# Patient Record
Sex: Female | Born: 1950
Health system: Southern US, Community
[De-identification: ages and names within clinical notes are randomized; demographics above are authoritative.]

## PROBLEM LIST (undated history)

## (undated) DIAGNOSIS — G43019 Migraine without aura, intractable, without status migrainosus: Secondary | ICD-10-CM

## (undated) DIAGNOSIS — B019 Varicella without complication: Secondary | ICD-10-CM

## (undated) DIAGNOSIS — T7840XA Allergy, unspecified, initial encounter: Secondary | ICD-10-CM

## (undated) DIAGNOSIS — J45909 Unspecified asthma, uncomplicated: Secondary | ICD-10-CM

## (undated) DIAGNOSIS — M069 Rheumatoid arthritis, unspecified: Secondary | ICD-10-CM

## (undated) DIAGNOSIS — M810 Age-related osteoporosis without current pathological fracture: Secondary | ICD-10-CM

## (undated) DIAGNOSIS — B2790 Infectious mononucleosis, unspecified without complication: Secondary | ICD-10-CM

## (undated) DIAGNOSIS — A0472 Enterocolitis due to Clostridium difficile, not specified as recurrent: Secondary | ICD-10-CM

## (undated) HISTORY — DX: Unspecified asthma, uncomplicated: J45.909

## (undated) HISTORY — DX: Enterocolitis due to Clostridium difficile, not specified as recurrent: A04.72

## (undated) HISTORY — DX: Varicella without complication: B01.9

## (undated) HISTORY — DX: Migraine without aura, intractable, without status migrainosus: G43.019

## (undated) HISTORY — DX: Allergy, unspecified, initial encounter: T78.40XA

## (undated) HISTORY — PX: BREAST CYST EXCISION: SHX579

## (undated) HISTORY — DX: Rheumatoid arthritis, unspecified: M06.9

## (undated) HISTORY — PX: HERNIA REPAIR: SHX51

## (undated) HISTORY — DX: Infectious mononucleosis, unspecified without complication: B27.90

## (undated) HISTORY — PX: KIDNEY STONE SURGERY: SHX686

## (undated) HISTORY — DX: Age-related osteoporosis without current pathological fracture: M81.0

---

## 1984-09-04 DIAGNOSIS — B2790 Infectious mononucleosis, unspecified without complication: Secondary | ICD-10-CM

## 1984-09-04 HISTORY — DX: Infectious mononucleosis, unspecified without complication: B27.90

## 1988-09-04 HISTORY — PX: KNEE SURGERY: SHX244

## 1991-09-05 HISTORY — PX: LAPAROSCOPY: SHX197

## 1992-09-04 HISTORY — PX: OTHER SURGICAL HISTORY: SHX169

## 1996-09-04 HISTORY — PX: ANTERIOR CRUCIATE LIGAMENT REPAIR: SHX115

## 2010-12-29 ENCOUNTER — Ambulatory Visit (INDEPENDENT_AMBULATORY_CARE_PROVIDER_SITE_OTHER): Payer: BC Managed Care – PPO | Admitting: Family Medicine

## 2010-12-29 ENCOUNTER — Encounter: Payer: Self-pay | Admitting: Family Medicine

## 2010-12-29 DIAGNOSIS — Z0000 Encounter for general adult medical examination without abnormal findings: Secondary | ICD-10-CM

## 2010-12-29 LAB — POCT URINALYSIS DIPSTICK
Ketones, UA: NEGATIVE
Protein, UA: NEGATIVE
Spec Grav, UA: 1.015
Urobilinogen, UA: 0.2
pH, UA: 8.5

## 2010-12-29 NOTE — Progress Notes (Signed)
  Subjective:    Patient ID: April Waters, female    DOB: 02-02-1951, 60 y.o.   MRN: [id number]  April Waters is a 60 year old, married female, registered nurse, who recently moved here from [place] who comes in today as a new patient to get the general physical examination  She's always been in good, health she's had no chronic health problems.  She did have bilateral inguinal hernia repairs as a 61-month-old baby, nasal polyps, removed, sebaceous cyst, right breast at age 19, torn cartilage, right knee, 1990 ACL right knee, 1998, diagnostic laparoscopy for endometriosis 1993.  She does have a history of migraine headaches that are related to food.  She needs an eye exam referred to Dr. Vonna Kotyk.  Also needs a dentist referred to Dr. Alvester Morin.  She said the history of cardiac or pulmonary disease.  She does have occasional palpitations skipping she declines any medication.  It is not related to exercise.  He's had a flexible sigmoidoscopy and a colonoscopy.  She did have some bright red rectal bleeding in March for 3 days.  Pap October 2011 normal.  She does not do BSE monthly mammogram October 2011 normal.    Review of Systems  Constitutional: Negative.   HENT: Negative.   Eyes: Negative.   Respiratory: Negative.   Cardiovascular: Negative.   Gastrointestinal: Negative.   Genitourinary: Negative.   Musculoskeletal: Negative.   Neurological: Negative.   Hematological: Negative.   Psychiatric/Behavioral: Negative.        Objective:   Physical Exam  Constitutional: She appears well-developed and well-nourished.  HENT:  Head: Normocephalic and atraumatic.  Right Ear: External ear normal.  Left Ear: External ear normal.  Nose: Nose normal.  Mouth/Throat: Oropharynx is clear and moist.  Eyes: EOM are normal. Pupils are equal, round, and reactive to light.  Neck: Normal range of motion. Neck supple. No thyromegaly present.  Cardiovascular: Normal rate, regular rhythm, normal heart sounds and  intact distal pulses.  Exam reveals no gallop and no friction rub.   No murmur heard. Pulmonary/Chest: Effort normal and breath sounds normal.  Abdominal: Soft. Bowel sounds are normal. She exhibits no distension and no mass. There is no tenderness. There is no rebound.  Genitourinary: Guaiac negative stool.       Cystic changes bilaterally  Musculoskeletal: Normal range of motion.  Lymphadenopathy:    She has no cervical adenopathy.  Neurological: She is alert. She has normal reflexes. No cranial nerve deficit. She exhibits normal muscle tone. Coordination normal.  Skin: Skin is warm and dry.  Psychiatric: She has a normal mood and affect. Her behavior is normal. Judgment and thought content normal.          Assessment & Plan:  Healthy female.  Migraine headaches.  Continue home therapy.  History of bright red rectal bleeding resolved.  Normal colonoscopy.  Occasional palpitations.  Declines any medication at this time.  History of kidney stone x 1 pass spontaneously.  History of fibrocystic breast changes.  Recommend annual mammography in BSE monthly.  Status post inguinal hernia repair at age 41 months.  Status post nasal polyps removed.  History of sebaceous cyst removal, right breast at age 66.  Torn cartilage right knee, along with ACL repair.  History of endometriosis premenopausal.  Patient was advised to continue her excellent health habits and return in one year for follow-up, sooner if any problem

## 2010-12-29 NOTE — Patient Instructions (Signed)
see Dr. Leonard Schwartz. This for an eye exam and Dr. Alvester Morin for dental care.  Continued good health habits and follow-up in one year for a physical exam, sooner if any problems

## 2010-12-30 LAB — HEPATIC FUNCTION PANEL
Albumin: 4.2 g/dL (ref 3.5–5.2)
Alkaline Phosphatase: 45 U/L (ref 39–117)
Bilirubin, Direct: 0 mg/dL (ref 0.0–0.3)
Total Protein: 6.2 g/dL (ref 6.0–8.3)

## 2010-12-30 LAB — BASIC METABOLIC PANEL
Calcium: 10.1 mg/dL (ref 8.4–10.5)
Creatinine, Ser: 0.5 mg/dL (ref 0.4–1.2)
GFR: 125.48 mL/min (ref >60.00)
Glucose, Bld: 93 mg/dL (ref 70–99)
Sodium: 138 mEq/L (ref 135–145)

## 2010-12-30 LAB — LIPID PANEL
HDL: 82 mg/dL (ref >39.00)
Triglycerides: 192 mg/dL — ABNORMAL HIGH (ref 0.0–149.0)
VLDL: 38.4 mg/dL (ref 0.0–40.0)

## 2010-12-30 LAB — LDL CHOLESTEROL, DIRECT: Direct LDL: 128.1 mg/dL

## 2011-01-01 LAB — VITAMIN D 1,25 DIHYDROXY: Vitamin D 1, 25 (OH)2 Total: 60 pg/mL (ref 18–72)

## 2011-01-04 NOTE — Progress Notes (Signed)
Left message labs were normal

## 2011-01-09 ENCOUNTER — Ambulatory Visit: Payer: Self-pay | Admitting: Internal Medicine

## 2011-01-12 ENCOUNTER — Encounter: Payer: Self-pay | Admitting: Family Medicine

## 2011-01-16 ENCOUNTER — Ambulatory Visit: Payer: Self-pay | Admitting: Family Medicine

## 2011-03-09 ENCOUNTER — Telehealth: Payer: Self-pay | Admitting: Family Medicine

## 2011-03-09 NOTE — Telephone Encounter (Signed)
Pt called and has questions re: ppd skin test. Pls call.

## 2011-03-09 NOTE — Telephone Encounter (Signed)
patient  Coming in for ppd placement

## 2011-03-13 ENCOUNTER — Ambulatory Visit: Payer: BC Managed Care – PPO | Admitting: Family Medicine

## 2011-03-28 ENCOUNTER — Ambulatory Visit: Payer: Self-pay | Admitting: Family Medicine

## 2011-05-19 ENCOUNTER — Ambulatory Visit (INDEPENDENT_AMBULATORY_CARE_PROVIDER_SITE_OTHER): Payer: BC Managed Care – PPO | Admitting: *Deleted

## 2011-05-19 DIAGNOSIS — Z23 Encounter for immunization: Secondary | ICD-10-CM

## 2011-06-20 ENCOUNTER — Ambulatory Visit (INDEPENDENT_AMBULATORY_CARE_PROVIDER_SITE_OTHER): Payer: BC Managed Care – PPO | Admitting: Family Medicine

## 2011-06-20 ENCOUNTER — Encounter: Payer: Self-pay | Admitting: Family Medicine

## 2011-06-20 ENCOUNTER — Other Ambulatory Visit: Payer: Self-pay | Admitting: Family Medicine

## 2011-06-20 VITALS — BP 98/64 | Temp 97.5°F | Ht 64.0 in | Wt 91.0 lb

## 2011-06-20 DIAGNOSIS — D235 Other benign neoplasm of skin of trunk: Secondary | ICD-10-CM | POA: Insufficient documentation

## 2011-06-20 NOTE — Patient Instructions (Signed)
Remove the Band-Aid tomorrow.  If we do not call you in the next two weeks with the path report.  Then call us.

## 2011-06-20 NOTE — Progress Notes (Signed)
  Subjective:    Patient ID: April Waters, female    DOB: 20-Oct-1950, 60 y.o.   MRN: [id number]  HPI April Waters is a 60 year old female, who comes in today for evaluation of an irritated lesion on her back.  She states for the past two, months she's had a spot on her back has been itching and irritated.  On physical exam its a mole that has some irregular margins.  She has dark skin.  However, she grew up in [place] and had a lot of sun exposure and some burning when she was a teenager.  No previous history of any skin problems.  After informed consent she was taken to the treatment room and the lesion was anesthetized with 1% Xylocaine with epinephrine.  The lesion was excised with 3-mm margins and sent for pathologic analysis.  The base was cauterized and it was applied.  She tolerated the procedure well.  No complications.  Size the lesion.  8 mm times 8 mm  Clinical diagnosis probable dysplastic nevus.  Rule out melanoma   Review of Systems    General in dermatologic review of systems otherwise negative Objective:   Physical Exam Procedure see above       Assessment & Plan:  Lesion removed, rule out melanoma.  Probable dysplastic nevus

## 2011-11-13 ENCOUNTER — Telehealth: Payer: Self-pay | Admitting: Family Medicine

## 2011-11-13 NOTE — Telephone Encounter (Signed)
Opened in error

## 2011-12-25 ENCOUNTER — Other Ambulatory Visit: Payer: BC Managed Care – PPO

## 2012-01-01 ENCOUNTER — Encounter: Payer: BC Managed Care – PPO | Admitting: Family Medicine

## 2012-01-05 ENCOUNTER — Telehealth: Payer: Self-pay | Admitting: Family Medicine

## 2012-01-05 NOTE — Telephone Encounter (Signed)
Lab test ordered and husband is aware

## 2012-01-05 NOTE — Telephone Encounter (Signed)
Pt is reg to get a lab test for Rheumatoid Arthiritis added to her cpx labs in Aug. Pls advise.

## 2012-01-05 NOTE — Telephone Encounter (Signed)
Yesi

## 2012-02-08 ENCOUNTER — Other Ambulatory Visit (INDEPENDENT_AMBULATORY_CARE_PROVIDER_SITE_OTHER): Payer: BC Managed Care – PPO

## 2012-02-08 DIAGNOSIS — Z0000 Encounter for general adult medical examination without abnormal findings: Secondary | ICD-10-CM

## 2012-02-08 LAB — TSH: TSH: 0.9 u[IU]/mL (ref 0.35–5.50)

## 2012-02-08 LAB — HEPATIC FUNCTION PANEL
ALT: 22 U/L (ref 0–35)
AST: 26 U/L (ref 0–37)
Albumin: 4.1 g/dL (ref 3.5–5.2)
Total Bilirubin: 0.7 mg/dL (ref 0.3–1.2)
Total Protein: 6.3 g/dL (ref 6.0–8.3)

## 2012-02-08 LAB — BASIC METABOLIC PANEL
BUN: 9 mg/dL (ref 6–23)
Chloride: 107 mEq/L (ref 96–112)
Glucose, Bld: 71 mg/dL (ref 70–99)
Potassium: 4.5 mEq/L (ref 3.5–5.1)
Sodium: 143 mEq/L (ref 135–145)

## 2012-02-08 LAB — CBC WITH DIFFERENTIAL/PLATELET
Eosinophils Relative: 3.1 % (ref 0.0–5.0)
HCT: 44.5 % (ref 36.0–46.0)
Hemoglobin: 14.8 g/dL (ref 12.0–15.0)
Lymphs Abs: 0.9 10*3/uL (ref 0.7–4.0)
MCV: 92.7 fl (ref 78.0–100.0)
Monocytes Absolute: 0.4 10*3/uL (ref 0.1–1.0)
Neutro Abs: 3.2 10*3/uL (ref 1.4–7.7)
Platelets: 231 10*3/uL (ref 150.0–400.0)
WBC: 4.7 10*3/uL (ref 4.5–10.5)

## 2012-02-08 LAB — LIPID PANEL
Cholesterol: 182 mg/dL (ref 0–200)
VLDL: 7.4 mg/dL (ref 0.0–40.0)

## 2012-02-09 LAB — CYCLIC CITRUL PEPTIDE ANTIBODY, IGG: Cyclic Citrullin Peptide Ab: <2 U/mL (ref 0.0–5.0)

## 2012-04-04 ENCOUNTER — Other Ambulatory Visit: Payer: BC Managed Care – PPO

## 2012-04-09 ENCOUNTER — Encounter: Payer: Self-pay | Admitting: Family Medicine

## 2012-04-09 ENCOUNTER — Ambulatory Visit (INDEPENDENT_AMBULATORY_CARE_PROVIDER_SITE_OTHER): Payer: BC Managed Care – PPO | Admitting: Family Medicine

## 2012-04-09 ENCOUNTER — Encounter: Payer: BC Managed Care – PPO | Admitting: Family Medicine

## 2012-04-09 VITALS — BP 102/64 | Temp 98.1°F | Ht 63.5 in | Wt 91.0 lb

## 2012-04-09 DIAGNOSIS — R000 Tachycardia, unspecified: Secondary | ICD-10-CM | POA: Insufficient documentation

## 2012-04-09 DIAGNOSIS — N760 Acute vaginitis: Secondary | ICD-10-CM

## 2012-04-09 DIAGNOSIS — Z0000 Encounter for general adult medical examination without abnormal findings: Secondary | ICD-10-CM | POA: Insufficient documentation

## 2012-04-09 DIAGNOSIS — I499 Cardiac arrhythmia, unspecified: Secondary | ICD-10-CM

## 2012-04-09 DIAGNOSIS — D235 Other benign neoplasm of skin of trunk: Secondary | ICD-10-CM

## 2012-04-09 MED ORDER — ESTROGENS, CONJUGATED 0.625 MG/GM VA CREA: TOPICAL_CREAM | Freq: Every day | VAGINAL | Status: DC

## 2012-04-09 NOTE — Progress Notes (Signed)
  Subjective:    Patient ID: April Waters, female    DOB: Apr 26, 1951, 61 y.o.   MRN: [id number]  HPI April Waters is a 61 year old married female nonsmoker who comes in today for general physical examination  She's always been in excellent health she's had no chronic health problems. She exercises on a regular basis her weight is 91 pounds BP 102/64  She states for the past for 5 years she's had episodes of intermittent rapid heart rate. Sometimes last for a few seconds sometimes will last longer. She'll and drink small amounts of caffeine daily. She's able to exercise with no chest pain shortness of breath.  She's had some soreness in her hands and has had tests for arthritis the past they've all been negative  She gets routine eye care, dental care, mammogram this spring normal, she does not check her breasts monthly, colonoscopy 2005 normal. She brings in a CT scan from [place] dated 2006. At that time she was noted to have cystic lesions in the liver. Since his been 9 years and she's had no problems I do not take rescan he is indicated   Review of Systems  Constitutional: Negative.   HENT: Negative.   Eyes: Negative.   Respiratory: Negative.   Cardiovascular: Negative.   Gastrointestinal: Negative.   Genitourinary: Negative.   Musculoskeletal: Negative.   Neurological: Negative.   Hematological: Negative.   Psychiatric/Behavioral: Negative.        Objective:   Physical Exam  Constitutional: She appears well-developed and well-nourished.  HENT:  Head: Normocephalic and atraumatic.  Right Ear: External ear normal.  Left Ear: External ear normal.  Nose: Nose normal.  Mouth/Throat: Oropharynx is clear and moist.  Eyes: EOM are normal. Pupils are equal, round, and reactive to light.  Neck: Normal range of motion. Neck supple. No thyromegaly present.  Cardiovascular: Normal rate, regular rhythm, normal heart sounds and intact distal pulses.  Exam reveals no gallop and no  friction rub.   No murmur heard. Pulmonary/Chest: Effort normal and breath sounds normal.  Abdominal: Soft. Bowel sounds are normal. She exhibits no distension and no mass. There is no tenderness. There is no rebound.  Genitourinary:       Bilateral breast exam normal  Extreme labial redness and irritation  Musculoskeletal: Normal range of motion.  Lymphadenopathy:    She has no cervical adenopathy.  Neurological: She is alert. She has normal reflexes. No cranial nerve deficit. She exhibits normal muscle tone. Coordination normal.  Skin: Skin is warm and dry.  Psychiatric: She has a normal mood and affect. Her behavior is normal. Judgment and thought content normal.          Assessment & Plan:  Healthy female  Osteoarthritis Motrin 600 twice a day  Postmenopausal vaginal dryness hormonal cream 3 times weekly for 3 months then twice weekly  Palpitations probably PACs by her description since she is asymptomatic will not treat at this time  History of dysplastic nevi sunscreens yearly skin exam

## 2012-04-09 NOTE — Patient Instructions (Signed)
Use small amounts of the hormonal cream 3 times weekly for 3 months and then twice weekly  Return in one year sooner if any problems

## 2012-04-10 ENCOUNTER — Telehealth: Payer: Self-pay | Admitting: *Deleted

## 2012-04-10 NOTE — Telephone Encounter (Signed)
I spoke with the patient and explained the ranges and she insists that those are not her lab results.  She believes that the results are someone else's that have been put in the wrong chart and would like this to be brought to someone's attention so it does not happen again and possibly have it removed from her record.

## 2012-04-10 NOTE — Telephone Encounter (Signed)
Patient is concerned because her lab report shows that her Cholestrol was 240 and Triglycerides were 192 last year and the report states "normal".  She is wants to know if this was a lab error or if she should be worried about her lipids? Suggestions?

## 2012-04-10 NOTE — Telephone Encounter (Signed)
Fleet Contras please call there is a wide range of normal and she still okay does not need to take any medication

## 2012-04-11 ENCOUNTER — Encounter: Payer: BC Managed Care – PPO | Admitting: Family Medicine

## 2012-04-11 NOTE — Telephone Encounter (Signed)
She also wants to state that her lipid numbers should not have changed because her life style nor has she started any medication.

## 2012-04-18 NOTE — Telephone Encounter (Signed)
Fleet Contras I tried to call her but there was no answer and no recording machine. Explained to her that there is a lot of variation if he would feel better than I would be happy to sit down and talk with her when I come back in 10 days to review the data

## 2012-04-19 NOTE — Telephone Encounter (Signed)
Spoke with patient and an appointment made 

## 2012-04-24 ENCOUNTER — Telehealth: Payer: Self-pay

## 2012-04-24 NOTE — Telephone Encounter (Signed)
Patient request a copy of lab work from 2012 when she saw Dr. Neva Seat for stomach issues.

## 2012-04-24 NOTE — Telephone Encounter (Signed)
Copied for pickup. Left voicemail.

## 2012-05-02 ENCOUNTER — Ambulatory Visit: Payer: BC Managed Care – PPO | Admitting: Family Medicine

## 2012-05-14 ENCOUNTER — Ambulatory Visit (INDEPENDENT_AMBULATORY_CARE_PROVIDER_SITE_OTHER): Payer: BC Managed Care – PPO | Admitting: Family Medicine

## 2012-05-14 VITALS — BP 92/54 | HR 83 | Temp 98.2°F | Resp 14 | Ht 63.5 in | Wt 89.4 lb

## 2012-05-14 DIAGNOSIS — Z23 Encounter for immunization: Secondary | ICD-10-CM

## 2012-05-16 ENCOUNTER — Ambulatory Visit: Payer: BC Managed Care – PPO

## 2012-09-01 ENCOUNTER — Ambulatory Visit (INDEPENDENT_AMBULATORY_CARE_PROVIDER_SITE_OTHER): Payer: BC Managed Care – PPO | Admitting: Family Medicine

## 2012-09-01 VITALS — BP 114/72 | HR 106 | Temp 98.3°F | Resp 20 | Ht 64.0 in | Wt 88.6 lb

## 2012-09-01 DIAGNOSIS — R05 Cough: Secondary | ICD-10-CM

## 2012-09-01 DIAGNOSIS — R142 Eructation: Secondary | ICD-10-CM

## 2012-09-01 DIAGNOSIS — R059 Cough, unspecified: Secondary | ICD-10-CM

## 2012-09-01 DIAGNOSIS — R141 Gas pain: Secondary | ICD-10-CM

## 2012-09-01 DIAGNOSIS — B779 Ascariasis, unspecified: Secondary | ICD-10-CM

## 2012-09-01 DIAGNOSIS — R140 Abdominal distension (gaseous): Secondary | ICD-10-CM

## 2012-09-01 MED ORDER — ALBENDAZOLE 200 MG PO TABS: 400.0000 mg | ORAL_TABLET | Freq: Once | ORAL | Status: DC

## 2012-09-01 NOTE — Patient Instructions (Signed)
Take the albendazole today.  Recheck in next week if not improving. Return to the clinic or go to the nearest emergency room if any of your symptoms worsen or new symptoms occur. Repeat stool test in 2-3 months to make sure larva have disappeared.

## 2012-09-01 NOTE — Progress Notes (Signed)
  Subjective:    Patient ID: April Waters, female    DOB: 11/21/1950, 61 y.o.   MRN: [id number]  HPI April Waters is a 61 y.o. female Noted worm in toilet after BM yesterday. Has noticed more gas, 4 bowel movements this am - loose, but not diarrhea. No fever, but feels warm.  Able to eat, but less hungry.  Notices increased gas/bloating.  Nausea, no vomiting.   Here with husband - he has not experienced any bowel difficulties or worms.  Tx: ibuprofen few times.    Went to [place] 1 month ago. More fatigue since returned from [place] - dry cough, but the cough has improved.   Review of Systems  Constitutional: Negative for fever and chills.  Gastrointestinal: Positive for nausea, abdominal pain, diarrhea (loose stols this am.  but not diarrhea. ) and abdominal distention (bloating). Negative for vomiting, blood in stool and anal bleeding.       Objective:   Physical Exam  Constitutional: She is oriented to person, place, and time. She appears well-developed. No distress.       Thin appearance.   HENT:  Head: Normocephalic and atraumatic.  Cardiovascular: Normal rate, regular rhythm, normal heart sounds and intact distal pulses.   Pulmonary/Chest: Effort normal and breath sounds normal.  Abdominal: Soft. She exhibits no distension. There is tenderness (slight discomfort with deep palpation - lower bilterally. no guarding. ). There is no rebound and no guarding.  Neurological: She is alert and oriented to person, place, and time.  Skin: Skin is warm and dry. No rash noted. She is not diaphoretic.  Psychiatric: She has a normal mood and affect. Her behavior is normal.   Specimen provided form home - living 4-5 cm pink colored round worm.     Assessment & Plan:  April Waters is a 61 y.o. female 1. Ascariasis  albendazole (ALBENZA) 200 MG tablet  2. Bloating      Suspected intial Loeffler's syndrome with cough and now roundworm infection with specimen noted in office. Likely cause of  bloating and loose stools.  Recommended cxr and bloodwork,  but deferred today due to cost. check stool for O and P.  Will treat with Albendazole 400mg  x 1. but if not showing improvement in next week, or worsening sooner - rtc.    should be reevaluated in two to three months, following therapy with repeat stool microscopy. Detection of eggs suggests inadequate elimination of adult worms or reinfection.  Will also treat household contact - husband.   rtc precautions discussed.   Patient Instructions  Take the albendazole today.  Recheck in next week if not improving. Return to the clinic or go to the nearest emergency room if any of your symptoms worsen or new symptoms occur. Repeat stool test in 2-3 months to make sure larva have disappeared.

## 2012-09-03 LAB — OVA AND PARASITE EXAMINATION: OP: NONE SEEN

## 2012-10-01 ENCOUNTER — Other Ambulatory Visit: Payer: Self-pay

## 2012-10-01 ENCOUNTER — Ambulatory Visit: Payer: BC Managed Care – PPO

## 2012-10-01 ENCOUNTER — Other Ambulatory Visit (INDEPENDENT_AMBULATORY_CARE_PROVIDER_SITE_OTHER): Payer: BC Managed Care – PPO

## 2012-10-01 DIAGNOSIS — B779 Ascariasis, unspecified: Secondary | ICD-10-CM

## 2012-10-01 DIAGNOSIS — R140 Abdominal distension (gaseous): Secondary | ICD-10-CM

## 2012-10-01 DIAGNOSIS — R141 Gas pain: Secondary | ICD-10-CM

## 2012-10-01 NOTE — Progress Notes (Signed)
Patient brought in specimen for O&P and micro stool.  Okay per Eula Listen PA-C

## 2013-06-20 ENCOUNTER — Other Ambulatory Visit: Payer: Self-pay | Admitting: Family Medicine

## 2013-06-21 ENCOUNTER — Ambulatory Visit (INDEPENDENT_AMBULATORY_CARE_PROVIDER_SITE_OTHER): Payer: BC Managed Care – PPO | Admitting: Family Medicine

## 2013-06-21 VITALS — BP 108/76 | HR 90 | Temp 98.2°F | Resp 16 | Ht 63.0 in | Wt 86.4 lb

## 2013-06-21 DIAGNOSIS — R197 Diarrhea, unspecified: Secondary | ICD-10-CM

## 2013-06-21 LAB — POCT CBC
Granulocyte percent: 74.8 %G (ref 37–80)
MCH, POC: 31.8 pg — AB (ref 27–31.2)
MID (cbc): 0.5 (ref 0–0.9)
MPV: 8.7 fL (ref 0–99.8)
POC LYMPH PERCENT: 18.3 %L (ref 10–50)
POC MID %: 6.9 %M (ref 0–12)
Platelet Count, POC: 289 10*3/uL (ref 142–424)
RBC: 4.69 M/uL (ref 4.04–5.48)
RDW, POC: 13.4 %
WBC: 7.7 10*3/uL (ref 4.6–10.2)

## 2013-06-21 LAB — COMPREHENSIVE METABOLIC PANEL
ALT: 20 U/L (ref 0–35)
AST: 23 U/L (ref 0–37)
Albumin: 4.6 g/dL (ref 3.5–5.2)
CO2: 27 mEq/L (ref 19–32)
Calcium: 10 mg/dL (ref 8.4–10.5)
Chloride: 99 mEq/L (ref 96–112)
Creat: 0.72 mg/dL (ref 0.50–1.10)
Glucose, Bld: 105 mg/dL — ABNORMAL HIGH (ref 70–99)
Potassium: 4.9 mEq/L (ref 3.5–5.3)
Sodium: 137 mEq/L (ref 135–145)
Total Bilirubin: 0.6 mg/dL (ref 0.3–1.2)
Total Protein: 7 g/dL (ref 6.0–8.3)

## 2013-06-21 NOTE — Progress Notes (Signed)
[facility] [address], [place] [address] [phone]  Date:  06/21/2013   Name:  April Waters   DOB:  1951-06-21   MRN:  [id number]  PCP:  Evette Georges, MD    Chief Complaint: Diarrhea   History of Present Illness:  April Waters is a 62 y.o. very pleasant female patient who presents with the following:  One month ago she awoke with a ST- "it felt like I had a head cold."  She then had one bout of diarrhea and had some gas.  She got better.  2 weeks ago she noted increased abdominal gas and 1 episode of diarrhea.  She took some sort of abx product that she bought OTC in [place].  Finished this a week ago.  She felt better, but she currently notes soft stools 2 or 3 times a day.   She brought a stool sample and wants to be tested for any parasites.    She states that she "pooped a worm" in December of 2013.  She was treated for this at [facility]; her stool O and P was negative but she apparently did have a live worm with her in a jar that day.  She had noted a "terrible taste in my mouth and I was very irritatble" prior to this episode.    She has not noted a fever, no anal itching.   She has not vomited, but felt a little nauseted this am only.  She has been eating ok.   menopausal Patient Active Problem List   Diagnosis Date Noted  . Routine general medical examination at a health care facility 04/09/2012  . Rapid heart rate 04/09/2012  . Dysplastic nevus of trunk 06/20/2011    Past Medical History  Diagnosis Date  . Allergy   . C. difficile enteritis   . Measles   . Chicken pox   . Malachi Carl infection 1986    lasted about a year late diagnosis  . Arthritis   . Osteoporosis     Past Surgical History  Procedure Laterality Date  . Hernia repair      bilateral inguinal 2 months  . Nasal polyps      age 13  . Cebaceous cyst      right breast - age 68  . Knee surgery  1990    medial meniscous right  . Fluid knee  1994    left  .  Anterior cruciate ligament repair  1998    right  . Laparoscopy  1993    endometriosis  . Kidney stone surgery      1980    History  Substance Use Topics  . Smoking status: Former Games developer  . Smokeless tobacco: Not on file  . Alcohol Use:     Family History  Problem Relation Age of Onset  . Hypertension Mother   . Diabetes Mother     Allergies  Allergen Reactions  . Cardizem [Diltiazem Hcl]   . Codeine   . Penicillins     Medication list has been reviewed and updated.  No current outpatient prescriptions on file prior to visit.   No current facility-administered medications on file prior to visit.    Review of Systems:  As per HPI- otherwise negative.   Physical Examination: Filed Vitals:   06/21/13 1328  BP: 108/76  Pulse: 90  Temp: 98.2 F (36.8 C)  Resp: 16   Filed Vitals:   06/21/13 1328  Height: 5\' 3"  (  1.6 m)  Weight: 86 lb 6.4 oz (39.191 kg)   Body mass index is 15.31 kg/(m^2). Ideal Body Weight: Weight in (lb) to have BMI = 25: 140.8  GEN: WDWN, NAD, Non-toxic, A & O x 3, think build HEENT: Atraumatic, Normocephalic. Neck supple. No masses, No LAD. Ears and Nose: No external deformity. CV: RRR, No M/G/R. No JVD. No thrill. No extra heart sounds. PULM: CTA B, no wheezes, crackles, rhonchi. No retractions. No resp. distress. No accessory muscle use. ABD: S, NT, ND, +BS. No rebound. No HSM. EXTR: No c/c/e NEURO Normal gait.  PSYCH: Normally interactive. Conversant. Not depressed or anxious appearing.  Calm demeanor.    Wt Readings from Last 3 Encounters:  06/21/13 86 lb 6.4 oz (39.191 kg)  09/01/12 88 lb 9.6 oz (40.189 kg)  05/14/12 89 lb 6.4 oz (40.552 kg)   Results for orders placed in visit on 06/21/13  POCT CBC      Result Value Range   WBC 7.7  4.6 - 10.2 K/uL   Lymph, poc 1.4  0.6 - 3.4   POC LYMPH PERCENT 18.3  10 - 50 %L   MID (cbc) 0.5  0 - 0.9   POC MID % 6.9  0 - 12 %M   POC Granulocyte 5.8  2 - 6.9   Granulocyte percent  74.8  37 - 80 %G   RBC 4.69  4.04 - 5.48 M/uL   Hemoglobin 14.9  12.2 - 16.2 g/dL   HCT, POC 16.1  09.6 - 47.9 %   MCV 98.7 (*) 80 - 97 fL   MCH, POC 31.8 (*) 27 - 31.2 pg   MCHC 32.2  31.8 - 35.4 g/dL   RDW, POC 04.5     Platelet Count, POC 289  142 - 424 K/uL   MPV 8.7  0 - 99.8 fL    Assessment and Plan: Diarrhea - Plan: POCT CBC, Ova and parasite screen, Clostridium difficile EIA, Comprehensive metabolic panel, CANCELED: Comprehensive metabolic panel, CANCELED: Clostridium difficile EIA, CANCELED: Clostridium difficile EIA  Await other labs- will give her a call.  She has had C diff in the past so will also rule this out.  At this time her sx are not severe.  If any change or worsening she will let me know right away   Signed Abbe Amsterdam, MD

## 2013-06-21 NOTE — Patient Instructions (Signed)
I will be in touch regarding your stool studies as soon as they come in.  If you are getting worse please let me know.

## 2013-06-22 LAB — CLOSTRIDIUM DIFFICILE EIA: CDIFTX: NEGATIVE

## 2013-06-26 ENCOUNTER — Telehealth: Payer: Self-pay | Admitting: Family Medicine

## 2013-06-26 ENCOUNTER — Telehealth: Payer: Self-pay | Admitting: *Deleted

## 2013-06-26 NOTE — Telephone Encounter (Signed)
Message copied by Cheri Kearns on Thu Jun 26, 2013 11:00 AM ------      Message from: Abbe Amsterdam C      Created: Thu Jun 26, 2013  8:44 AM       Can we please call [facility] about her C diff? I think it should be back by now.  Thanks!       JC ------

## 2013-06-26 NOTE — Telephone Encounter (Signed)
C diff complete.  Faxing results over.

## 2013-06-26 NOTE — Telephone Encounter (Signed)
Called and checked in on her.  She is "feeling a whole lot better."  Thus far her labs are negative.  O and P negative.  C diff is still pending.  We will check on this- if she does not hear about he c diff in a week or so please give me a call

## 2013-07-01 ENCOUNTER — Telehealth: Payer: Self-pay

## 2013-07-01 NOTE — Telephone Encounter (Signed)
We called- she is feeling fine.  All labs have actually already resulted so they cannot be canceled, but all are negative

## 2013-07-01 NOTE — Telephone Encounter (Signed)
PT STATES DR COPLAND WAS ORDERING A STOOL SAMPLE FOR HER BUT SHE DOESN'T NEED IT NOW, SHE IS FEELING GREAT AFTER DRINKING SOME ORGANIC [place] JUICE WOULD LIKE ANY ORDERING TO BE CANCELLED. YOU MAY REACH PT AT [phone] IF NEEDED

## 2014-01-20 ENCOUNTER — Telehealth: Payer: Self-pay

## 2014-01-20 NOTE — Telephone Encounter (Signed)
Patient can take medication for typhoid for immunity. Would need OV to discuss.

## 2014-01-20 NOTE — Telephone Encounter (Signed)
Pt.notified

## 2014-01-20 NOTE — Telephone Encounter (Signed)
Pt states relatives that live in [place] that might have chronic typhoid disease. Pt states the relatives may be coming to visit and she wants to know what type of immunizations are available that they may be able to take before relatives come.

## 2014-06-17 ENCOUNTER — Telehealth: Payer: Self-pay | Admitting: *Deleted

## 2014-06-17 DIAGNOSIS — Z205 Contact with and (suspected) exposure to viral hepatitis: Secondary | ICD-10-CM

## 2014-06-17 NOTE — Telephone Encounter (Signed)
Pt needs to have the Hep B Surface Antibody test drawn. 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  Lab orders have been entered. Pt is going to come in to have labs only- will decided next step once results are complete.  Letter received from patient has been sent to be scanned in.

## 2014-06-18 ENCOUNTER — Other Ambulatory Visit (INDEPENDENT_AMBULATORY_CARE_PROVIDER_SITE_OTHER): Payer: BC Managed Care – PPO | Admitting: *Deleted

## 2014-06-18 DIAGNOSIS — Z205 Contact with and (suspected) exposure to viral hepatitis: Secondary | ICD-10-CM

## 2014-06-19 LAB — HEPATITIS B SURFACE ANTIBODY,QUALITATIVE

## 2014-06-25 ENCOUNTER — Telehealth: Payer: Self-pay

## 2014-06-25 NOTE — Telephone Encounter (Signed)
Dr. Carlota Raspberry, pt calling about lab results. Please advise. Thanks

## 2014-07-02 ENCOUNTER — Other Ambulatory Visit: Payer: Self-pay | Admitting: Gastroenterology

## 2014-07-02 DIAGNOSIS — R1084 Generalized abdominal pain: Secondary | ICD-10-CM

## 2014-07-02 DIAGNOSIS — R102 Pelvic and perineal pain: Secondary | ICD-10-CM

## 2014-07-13 ENCOUNTER — Ambulatory Visit
Admission: RE | Admit: 2014-07-13 | Discharge: 2014-07-13 | Disposition: A | Discharge status: Home / Self Care | Payer: BC Managed Care – PPO | Source: Ambulatory Visit | Attending: Gastroenterology | Admitting: Gastroenterology

## 2014-07-13 DIAGNOSIS — R1084 Generalized abdominal pain: Secondary | ICD-10-CM

## 2014-07-13 DIAGNOSIS — R102 Pelvic and perineal pain: Secondary | ICD-10-CM

## 2014-07-17 ENCOUNTER — Telehealth: Payer: Self-pay | Admitting: Family Medicine

## 2014-07-17 NOTE — Telephone Encounter (Signed)
Patient states the blood test she had done came back inconclusive for a hepatitis shot. She needs to know if she needs to get a booster.   [phone]

## 2014-07-20 NOTE — Telephone Encounter (Signed)
It would probably be best to have booster for Hep B, then check immunity 4-6 weeks later. Nursing only visit ok for this. Thanks.

## 2014-07-21 ENCOUNTER — Ambulatory Visit (INDEPENDENT_AMBULATORY_CARE_PROVIDER_SITE_OTHER): Payer: BC Managed Care – PPO | Admitting: Family Medicine

## 2014-07-21 DIAGNOSIS — Z23 Encounter for immunization: Secondary | ICD-10-CM

## 2014-07-21 NOTE — Telephone Encounter (Signed)
Pt advised. Pt is going to stop by today to have the immunization completed.

## 2014-07-22 ENCOUNTER — Other Ambulatory Visit: Payer: Self-pay | Admitting: Obstetrics and Gynecology

## 2014-07-24 LAB — CYTOLOGY - PAP

## 2014-08-18 ENCOUNTER — Telehealth: Payer: Self-pay | Admitting: Family Medicine

## 2014-08-18 NOTE — Telephone Encounter (Signed)
Per labs pt was to rtn in 6 weeks to recheck immunity to Hep B.- Can these orders be put in to pend or do you want her to come in for OV? Called pt to advise to rtc- No answer.

## 2014-08-18 NOTE — Telephone Encounter (Signed)
Pt called back and would like to have the orders pended so she may come in.  She would like to come in on Monday.

## 2014-08-18 NOTE — Telephone Encounter (Signed)
Patient is calling to see when she should come in for her blood work. She also wants to know if an order will be placed for her blood work. States that Dr. Carlota Raspberry has been checking for an antibody with her Hep B vaccine.   [phone]

## 2014-08-24 ENCOUNTER — Ambulatory Visit (INDEPENDENT_AMBULATORY_CARE_PROVIDER_SITE_OTHER): Payer: BC Managed Care – PPO | Admitting: Family Medicine

## 2014-08-24 VITALS — BP 118/62 | HR 80 | Temp 97.6°F | Resp 16 | Ht 63.0 in | Wt 83.4 lb

## 2014-08-24 DIAGNOSIS — Z0184 Encounter for antibody response examination: Secondary | ICD-10-CM

## 2014-08-24 NOTE — Progress Notes (Unsigned)
Opened in error. Philis Fendt, MS, PA-C   4:26 PM, 08/24/2014

## 2014-08-24 NOTE — Telephone Encounter (Signed)
Patient in office and will have repeat labs. Verbal by Philis Fendt PA-C

## 2014-08-25 LAB — HEPATITIS B SURFACE ANTIBODY, QUANTITATIVE: HEPATITIS B-POST: 360 m[IU]/mL

## 2015-05-06 ENCOUNTER — Telehealth: Payer: Self-pay

## 2015-05-06 NOTE — Telephone Encounter (Signed)
Left voicemail asking the patient to call back to r/s 9/16 appointment d/t Dr. Jannifer Franklin being out of the office that day.

## 2015-05-11 NOTE — Telephone Encounter (Signed)
Appointment r/s to 9/21.

## 2015-05-21 ENCOUNTER — Ambulatory Visit: Payer: 59 | Admitting: Neurology

## 2015-05-26 ENCOUNTER — Ambulatory Visit (INDEPENDENT_AMBULATORY_CARE_PROVIDER_SITE_OTHER): Payer: 59 | Admitting: Neurology

## 2015-05-26 ENCOUNTER — Encounter: Payer: Self-pay | Admitting: Neurology

## 2015-05-26 VITALS — BP 123/77 | HR 76 | Ht 63.0 in | Wt 82.5 lb

## 2015-05-26 DIAGNOSIS — G43019 Migraine without aura, intractable, without status migrainosus: Secondary | ICD-10-CM | POA: Diagnosis not present

## 2015-05-26 HISTORY — DX: Migraine without aura, intractable, without status migrainosus: G43.019

## 2015-05-26 NOTE — Patient Instructions (Addendum)
  Try magnesium suplimentation 250 mg, and riboflavin 400 mg daily.    Migraine Headache A migraine headache is an intense, throbbing pain on one or both sides of your head. A migraine can last for 30 minutes to several hours. CAUSES  The exact cause of a migraine headache is not always known. However, a migraine may be caused when nerves in the brain become irritated and release chemicals that cause inflammation. This causes pain. Certain things may also trigger migraines, such as:  Alcohol.  Smoking.  Stress.  Menstruation.  Aged cheeses.  Foods or drinks that contain nitrates, glutamate, aspartame, or tyramine.  Lack of sleep.  Chocolate.  Caffeine.  Hunger.  Physical exertion.  Fatigue.  Medicines used to treat chest pain (nitroglycerine), birth control pills, estrogen, and some blood pressure medicines. SIGNS AND SYMPTOMS  Pain on one or both sides of your head.  Pulsating or throbbing pain.  Severe pain that prevents daily activities.  Pain that is aggravated by any physical activity.  Nausea, vomiting, or both.  Dizziness.  Pain with exposure to bright lights, loud noises, or activity.  General sensitivity to bright lights, loud noises, or smells. Before you get a migraine, you may get warning signs that a migraine is coming (aura). An aura may include:  Seeing flashing lights.  Seeing bright spots, halos, or zigzag lines.  Having tunnel vision or blurred vision.  Having feelings of numbness or tingling.  Having trouble talking.  Having muscle weakness. DIAGNOSIS  A migraine headache is often diagnosed based on:  Symptoms.  Physical exam.  A CT scan or MRI of your head. These imaging tests cannot diagnose migraines, but they can help rule out other causes of headaches. TREATMENT Medicines may be given for pain and nausea. Medicines can also be given to help prevent recurrent migraines.  HOME CARE INSTRUCTIONS  Only take  over-the-counter or prescription medicines for pain or discomfort as directed by your health care provider. The use of long-term narcotics is not recommended.  Lie down in a dark, quiet room when you have a migraine.  Keep a journal to find out what may trigger your migraine headaches. For example, write down:  What you eat and drink.  How much sleep you get.  Any change to your diet or medicines.  Limit alcohol consumption.  Quit smoking if you smoke.  Get 7-9 hours of sleep, or as recommended by your health care provider.  Limit stress.  Keep lights dim if bright lights bother you and make your migraines worse. SEEK IMMEDIATE MEDICAL CARE IF:   Your migraine becomes severe.  You have a fever.  You have a stiff neck.  You have vision loss.  You have muscular weakness or loss of muscle control.  You start losing your balance or have trouble walking.  You feel faint or pass out.  You have severe symptoms that are different from your first symptoms. MAKE SURE YOU:   Understand these instructions.  Will watch your condition.  Will get help right away if you are not doing well or get worse. Document Released: 08/21/2005 Document Revised: 01/05/2014 Document Reviewed: 04/28/2013 [facility] Patient Information 2015 [place], [place]. This information is not intended to replace advice given to you by your health care provider. Make sure you discuss any questions you have with your health care provider.

## 2015-05-26 NOTE — Progress Notes (Signed)
Reason for visit: Migraine headache  Referring physician: Dr. [name] is a 64 y.o. female  History of present illness:  April Waters is a 64 year old Spanish female, right handed. The patient has a history of migraine headaches since age 49 or 46. She indicates that as a child her headaches were quite severe lasting 4 days at a time associated with severe photophobia, phonophobia, nausea and vomiting. In her teenage years, she had fewer headaches, but the headaches became more significant after age 63. Since May 2016, the headaches have been more frequent, but less severe. She has headaches about every other day at this point. The headaches are bifrontal in nature, more on the left than the right. The headache will usually start in the left side, and then spread to the right, and may last up to 2 days. She is not having a lot of nausea or vomiting at this point. She denies any significant visual aura at this time. She has had visual aura in the past. She indicates that perfumes, smoke, bitter foods, meat, eggs, and weather changes may bring on headaches. The patient has noted that bottled water and salt may bring on headaches. The patient has never been on any medications other then Advil or Tylenol for her headache. She has never been on a prophylactic medication. She indicates extreme medication sensitivity. She cannot take any opiate type medication. She indicates that currently she is able to work through the headache, she does not stop what she is doing. She may have some dizziness with the headache. She is sent to this office for an evaluation.  Past Medical History  Diagnosis Date  . Allergy   . C. difficile enteritis   . Measles   . Chicken pox   . [place] Patient infection 1986    lasted about a year late diagnosis  . Rheumatoid arthritis   . Osteoporosis   . Common migraine with intractable migraine 05/26/2015    Past Surgical History  Procedure Laterality Date  .  Hernia repair      bilateral inguinal 2 months  . Nasal polyps      age 33  . Cebaceous cyst      right breast - age 2  . Knee surgery  1990    medial meniscous right  . Fluid knee  1994    left  . Anterior cruciate ligament repair  1998    right  . Laparoscopy  1993    endometriosis  . Kidney stone surgery      1980    Family History  Problem Relation Age of Onset  . Hypertension Mother   . Diabetes Mother   . Migraines Mother   . Heart attack Father   . Lung cancer Father   . Colon cancer Maternal Grandmother     Social history:  reports that she quit smoking about 30 years ago. She has never used smokeless tobacco. She reports that she does not drink alcohol or use illicit drugs.  Medications:  Prior to Admission medications   Medication Sig Start Date End Date Taking? Authorizing Provider  acetaminophen (TYLENOL) 500 MG tablet Take 500 mg by mouth every 6 (six) hours as needed.   Yes Historical Provider, MD  ibuprofen (ADVIL,MOTRIN) 400 MG tablet Take 400 mg by mouth every 6 (six) hours as needed.   Yes Historical Provider, MD      Allergies  Allergen Reactions  . Cardizem [Diltiazem Hcl]   . Codeine   .  Penicillins     ROS:  Out of a complete 14 system review of symptoms, the patient complains only of the following symptoms, and all other reviewed systems are negative.  Fatigue Palpitations of the heart Cough Easy bruising Feeling hot, cold Joint pain, joint swelling Allergies, runny nose, skin sensitivity Headache, weakness  Blood pressure 123/77, pulse 76, height 5\' 3"  (1.6 m), weight 82 lb 8 oz (37.422 kg).  Physical Exam  General: The patient is alert and cooperative at the time of the examination.  Eyes: Pupils are equal, round, and reactive to light. Discs are flat bilaterally.  Neck: The neck is supple, no carotid bruits are noted.  Respiratory: The respiratory examination is clear.  Cardiovascular: The cardiovascular examination  reveals a regular rate and rhythm, no obvious murmurs or rubs are noted.  Skin: Extremities are without significant edema.  Neurologic Exam  Mental status: The patient is alert and oriented x 3 at the time of the examination. The patient has apparent normal recent and remote memory, with an apparently normal attention span and concentration ability.  Cranial nerves: Facial symmetry is present. There is good sensation of the face to pinprick and soft touch bilaterally. The strength of the facial muscles and the muscles to head turning and shoulder shrug are normal bilaterally. Speech is well enunciated, no aphasia or dysarthria is noted. Extraocular movements are full. Visual fields are full. The tongue is midline, and the patient has symmetric elevation of the soft palate. No obvious hearing deficits are noted.  Motor: The motor testing reveals 5 over 5 strength of all 4 extremities. Good symmetric motor tone is noted throughout.  Sensory: Sensory testing is intact to pinprick, soft touch, vibration sensation, and position sense on all 4 extremities. No evidence of extinction is noted.  Coordination: Cerebellar testing reveals good finger-nose-finger and heel-to-shin bilaterally.  Gait and station: Gait is normal. Tandem gait is normal. Romberg is negative. No drift is seen.  Reflexes: Deep tendon reflexes are symmetric and normal bilaterally. Toes are downgoing bilaterally.   Assessment/Plan:  1. Intractable migraine  The patient has significant issues with headaches, but she does not wish to go on any medications for the headache. She is not a candidate for Botox as she has not failed 2 or 3 prophylactic medications for migraine. The patient will go on some magnesium supplementation, and riboflavin 400 mg daily. If she desires, we may try a medication such as gabapentin in the future. She will contact our office if this is the case. Otherwise, she will follow-up on an as-needed  basis.  Jill Alexanders MD 05/26/2015 8:04 PM  [facility] [address] [place], [place] [address]  Phone [phone] Fax [phone]

## 2015-07-13 ENCOUNTER — Telehealth: Payer: Self-pay | Admitting: Neurology

## 2015-07-13 NOTE — Telephone Encounter (Signed)
Pt called sts she was taking 50mg  of riboflaven and slowing increased to 100mg  (suppose to be 400mg ) as she is sensitive to any medication. That same day she increased to 100mg , became extremely sensitive to sound. Sts she has sister that had meningioma (begnine) removed recently and pt is inquiring about having MRI of brain. She does not want CT or xray. She sts she had 3 ocular migraines this summer which is uncommon for her to have in that short time frame. Please call and advise.

## 2015-07-13 NOTE — Telephone Encounter (Signed)
I called patient. The patient has not even been able to take riboflavin at the dose recommended for migraine. She cannot take any other medication. Her headaches have increased in frequency some, but she is having typical migraine features with visual phenomenon with geometric shapes, bright colors. I do not think MRI evaluation is likely to be of high return.

## 2015-11-29 ENCOUNTER — Ambulatory Visit (INDEPENDENT_AMBULATORY_CARE_PROVIDER_SITE_OTHER): Payer: BLUE CROSS/BLUE SHIELD | Admitting: Pediatrics

## 2015-11-29 ENCOUNTER — Encounter: Payer: Self-pay | Admitting: Pediatrics

## 2015-11-29 VITALS — BP 120/68 | HR 80 | Temp 97.6°F | Resp 16 | Ht 62.99 in | Wt 76.7 lb

## 2015-11-29 DIAGNOSIS — J301 Allergic rhinitis due to pollen: Secondary | ICD-10-CM | POA: Diagnosis not present

## 2015-11-29 DIAGNOSIS — M069 Rheumatoid arthritis, unspecified: Secondary | ICD-10-CM

## 2015-11-29 DIAGNOSIS — R634 Abnormal weight loss: Secondary | ICD-10-CM

## 2015-11-29 DIAGNOSIS — J4520 Mild intermittent asthma, uncomplicated: Secondary | ICD-10-CM | POA: Diagnosis not present

## 2015-11-29 DIAGNOSIS — G43909 Migraine, unspecified, not intractable, without status migrainosus: Secondary | ICD-10-CM

## 2015-11-29 MED ORDER — MELOXICAM 7.5 MG PO TABS: ORAL_TABLET | ORAL | Status: DC

## 2015-11-29 MED ORDER — LEVALBUTEROL TARTRATE 45 MCG/ACT IN AERO: 2.0000 | INHALATION_SPRAY | Freq: Four times a day (QID) | RESPIRATORY_TRACT | Status: DC | PRN

## 2015-11-29 MED ORDER — AZELASTINE HCL 0.1 % NA SOLN: 1.0000 | Freq: Two times a day (BID) | NASAL | Status: DC

## 2015-11-29 NOTE — Patient Instructions (Addendum)
Environmental control of dust Claritin half a teaspoonful once a day if needed for runny nose or allergic symptoms. May increase up to 2 teaspoonfuls once a day Rhinocort 1 spray per nostril twice a day if needed for stuffy nose Opcon-A-one drop 3 times a day if needed for itchy eyes Prednisone 10 mg twice a day for 4 days 10 mg on day 5 to see if it can help the headaches and nasal congestion Try egg whites if possible to improve her dietary intake I gave her a list of foods associated with migraine headaches  Azelastine 0.1%-one spray per nostril twice a day if needed for stuffy nose or sinus headaches during times of high heat and humidity.  Xopenex 2 puffs every 6 hours if needed for wheezing or coughing spells  With her weight loss, I recommend that she be evaluated for celiac disease  Meloxicam 7.5 mg-one tablet once a day if needed for pain. You may increase dose to 15 mg once a day

## 2015-11-29 NOTE — Progress Notes (Signed)
[address] [place] [address] Dept: [phone]  New Patient Note  Patient ID: April Waters, female    DOB: Feb 24, 1951  Age: 65 y.o. MRN: [id number] Date of Office Visit: 11/29/2015 Referring provider: Maury Dus, MD [place], [place] [address]    Chief Complaint: Nasal Congestion; Allergic Rhinitis; and Asthma  HPI April Waters presents for evaluation of nasal congestion, asthma and possible food allergies.  Asthma-she had asthma in childhood and her symptoms improved as she got older. She now experiences asthma if she is exposed to cats or dogs. She has dogs in the basement.  Allergic rhinitis she has had nasal congestion since childhood. Her symptoms have been worse over the past 3 years. As a child she had nasal polyps removed at age 31. She had infectious mono at age 43 and was noted to be allergic to almost everything in the air and many foods. She gets headaches on hot humid days  Food allergy-she has severe migraine headaches aggravated by foods that contain tyramine antihistamine. She had allergy testing at age 49 and has been avoiding foods that she was allergic to then.. She has been on a vegan   diet'  She has lost 23 pounds in the past year perhaps due to food restriction but not completely. She used to weigh 100 pounds. She has mild rheumatoid arthritis. If she takes ibuprofen she gets nauseated.  Review of Systems  Constitutional:       23 pound weight loss in the past year or so without a clear-cut reason. She cannot tolerate normal doses of most medications  HENT:       Nasal congestion for several years  Eyes:       Itchy eyes at times  Respiratory:       Asthma in childhood  Cardiovascular:       Occasional palpitations from  medications  Gastrointestinal:       Occasional heartburn from nonsteroidal medications  Genitourinary:       Kidney stones once  Musculoskeletal:       Mild rheumatoid arthritis  Skin:   Hives when she had infectious mononucleosis at age 40  Neurological: Negative.   Endo/Heme/Allergies:       No thyroid disease. Allergic symptoms from cats and dogs  Psychiatric/Behavioral: Negative.     Outpatient Encounter Prescriptions as of 11/29/2015  Medication Sig  . CALCIUM-VITAMIN D PO Take 300 mg by mouth daily.  Marland Kitchen ibuprofen (ADVIL,MOTRIN) 400 MG tablet Take 400 mg by mouth every 6 (six) hours as needed.  . Pyridoxine HCl (B-6 PO) Take by mouth.  . Riboflavin (VITAMIN B-2 PO) Take by mouth.  Marland Kitchen acetaminophen (TYLENOL) 500 MG tablet Take 500 mg by mouth every 6 (six) hours as needed. Reported on 11/29/2015  . azelastine (ASTELIN) 0.1 % nasal spray Place 1 spray into both nostrils 2 (two) times daily. Use in each nostril as directed  . levalbuterol (XOPENEX HFA) 45 MCG/ACT inhaler Inhale 2 puffs into the lungs every 6 (six) hours as needed for wheezing (cough).  . meloxicam (MOBIC) 7.5 MG tablet Take one tablet once a day if needed for pain.  May increase to 15 mg once a day.   No facility-administered encounter medications on file as of 11/29/2015.     Drug Allergies:  Allergies  Allergen Reactions  . Cardizem [Diltiazem Hcl]   . Codeine   . Penicillins   . Vicodin [Hydrocodone-Acetaminophen] Other (See Comments)    Muscle  Weakness    Family History: Joyia's family history includes Allergic rhinitis in her sister and sister; Asthma in her sister; Colon cancer in her maternal grandmother; Diabetes in her mother; Heart attack in her father; Hypertension in her mother; Lung cancer in her father; Migraines in her mother. There is no history of Angioedema, Eczema, Immunodeficiency, Urticaria, or Atopy..  Social and environmental. She is not currently employed. She has 2 dogs in the basement. She used to be a Marine scientist. She used to smoke cigarettes for a 16 years averaging less than one pack per day. She quit smoking cigarettes in 1986  Physical Exam: BP 120/68 mmHg  Pulse 80   Temp(Src) 97.6 F (36.4 C) (Oral)  Resp 16  Ht 5' 2.99" (1.6 m)  Wt 76 lb 11.5 oz (34.8 kg)  BMI 13.59 kg/m2   Physical Exam  Constitutional: She is oriented to person, place, and time.  She is very thin  HENT:  Eyes normal. Ears normal. Nose mild swelling of nasal turbinates. Pharynx normal.  Neck: Neck supple. No thyromegaly present.  Cardiovascular:  S1 and S2 normal no murmurs  Pulmonary/Chest:  Clear to percussion and auscultation  Abdominal: Soft. There is no tenderness (no hepatosplenomegaly).  Lymphadenopathy:    She has no cervical adenopathy.  Neurological: She is alert and oriented to person, place, and time.  Skin:  Clear  Psychiatric: She has a normal mood and affect. Her behavior is normal. Judgment and thought content normal.  Vitals reviewed.   Diagnostics: Allergy skin tests were very positive to grass pollen, weeds, tree pollens, cat and dog. Skin testing to foods was negative  FVC 3.16 L FEV1 2.75 L predicted FVC 2.84 L predicted FEV1  2.27 liters-the spirometry is in the normal range   Assessment Assessment and Plan: 1. Mild intermittent asthma, uncomplicated   2. Allergic rhinitis due to pollen   3. Weight loss   4. Rheumatoid arthritis, involving unspecified site, unspecified rheumatoid factor presence ([place])     Meds ordered this encounter  Medications  . levalbuterol (XOPENEX HFA) 45 MCG/ACT inhaler    Sig: Inhale 2 puffs into the lungs every 6 (six) hours as needed for wheezing (cough).    Dispense:  1 Inhaler    Refill:  1  . azelastine (ASTELIN) 0.1 % nasal spray    Sig: Place 1 spray into both nostrils 2 (two) times daily. Use in each nostril as directed    Dispense:  30 mL    Refill:  5    Place on HOLD FOR PATIENT.  Patient will call for refill.  . meloxicam (MOBIC) 7.5 MG tablet    Sig: Take one tablet once a day if needed for pain.  May increase to 15 mg once a day.    Dispense:  60 tablet    Refill:  0    Patient Instructions    Environmental control of dust Claritin half a teaspoonful once a day if needed for runny nose or allergic symptoms. May increase up to 2 teaspoonfuls once a day Rhinocort 1 spray per nostril twice a day if needed for stuffy nose Opcon-A-one drop 3 times a day if needed for itchy eyes Prednisone 10 mg twice a day for 4 days 10 mg on day 5 to see if it can help the headaches and nasal congestion Try egg whites if possible to improve her dietary intake I gave her a list of foods associated with migraine headaches  Azelastine 0.1%-one spray per nostril twice  a day if needed for stuffy nose or sinus headaches during times of high heat and humidity.  Xopenex 2 puffs every 6 hours if needed for wheezing or coughing spells  With her weight loss, I recommend that she be evaluated for celiac disease  Meloxicam 7.5 mg-one tablet once a day if needed for pain. You may increase dose to 15 mg once a day    Return in about 4 weeks (around 12/27/2015).   Thank you for the opportunity to care for this patient.  Please do not hesitate to contact me with questions.  Penne Lash, M.D.  [facility] [address] [place], [place] [address] [phone]

## 2015-11-30 ENCOUNTER — Telehealth: Payer: Self-pay

## 2015-11-30 NOTE — Telephone Encounter (Signed)
PA submitted to cover my meds for Levaalbuterol Tartrate

## 2015-12-06 ENCOUNTER — Telehealth: Payer: Self-pay

## 2015-12-06 NOTE — Telephone Encounter (Signed)
Patient was seen by Dr. Shaune Leeks on 11/29/2015 she has a few questions about what she is exactly allergic to.  Please Advise  Thanks

## 2015-12-06 NOTE — Telephone Encounter (Signed)
Reviewed Dr. Shaune Leeks note.  Michela Pitcher it looks like she has allergies with Pollen

## 2015-12-09 NOTE — Telephone Encounter (Signed)
Insurance will not pay for her xopenex inhaler please change.

## 2015-12-09 NOTE — Telephone Encounter (Signed)
Call patient. Has she tried Pro-air 2 puffs every 4 hours if needed. If she has not had problems from albuterol go ahead and call in

## 2015-12-09 NOTE — Telephone Encounter (Signed)
Pts insurance is not wanting to cover her inhalers.  Please Advise

## 2015-12-10 NOTE — Telephone Encounter (Signed)
Spoke to patient.  She is not having any symptoms and is in no need of rescue medication at this time.  Advised forms were completed and awaiting physician's signature.  Advised patient if we could not obtain approval for the Xopenex she would like for [place] to call in Albuterol.  Please contact her regarding final outcome.

## 2015-12-15 MED ORDER — ALBUTEROL SULFATE HFA 108 (90 BASE) MCG/ACT IN AERS: 2.0000 | INHALATION_SPRAY | RESPIRATORY_TRACT | Status: DC | PRN

## 2015-12-15 NOTE — Telephone Encounter (Signed)
Sent in script for Proair HFA. Left message for patient to call office.

## 2015-12-15 NOTE — Telephone Encounter (Signed)
Spoke with patient and she states she does have palpitation and some tachycardia. Will get PA signed. D/c ProAir script that was sent into pharmacy.

## 2015-12-20 NOTE — Telephone Encounter (Signed)
Faxed PA to [facility]

## 2015-12-21 NOTE — Telephone Encounter (Signed)
PA approved and faxed to pharmacy:

## 2015-12-21 NOTE — Telephone Encounter (Signed)
CALLED AND ADVISED XOPENEX APPROVED

## 2015-12-27 ENCOUNTER — Ambulatory Visit: Payer: BLUE CROSS/BLUE SHIELD | Admitting: Pediatrics

## 2016-01-03 ENCOUNTER — Ambulatory Visit: Payer: BLUE CROSS/BLUE SHIELD | Admitting: Pediatrics

## 2016-03-14 ENCOUNTER — Other Ambulatory Visit: Payer: Self-pay | Admitting: Dermatology

## 2016-08-01 ENCOUNTER — Other Ambulatory Visit: Payer: Self-pay | Admitting: Family Medicine

## 2016-08-01 ENCOUNTER — Ambulatory Visit
Admission: RE | Admit: 2016-08-01 | Discharge: 2016-08-01 | Disposition: A | Discharge status: Home / Self Care | Payer: BLUE CROSS/BLUE SHIELD | Source: Ambulatory Visit | Attending: Family Medicine | Admitting: Family Medicine

## 2016-08-01 DIAGNOSIS — M25531 Pain in right wrist: Secondary | ICD-10-CM

## 2016-08-02 ENCOUNTER — Other Ambulatory Visit: Payer: Self-pay | Admitting: Family Medicine

## 2016-08-02 DIAGNOSIS — M25531 Pain in right wrist: Secondary | ICD-10-CM

## 2016-08-04 ENCOUNTER — Other Ambulatory Visit: Payer: BLUE CROSS/BLUE SHIELD

## 2017-01-02 DIAGNOSIS — R69 Illness, unspecified: Secondary | ICD-10-CM | POA: Diagnosis not present

## 2017-01-03 DIAGNOSIS — Z0100 Encounter for examination of eyes and vision without abnormal findings: Secondary | ICD-10-CM | POA: Diagnosis not present

## 2017-01-22 DIAGNOSIS — G43109 Migraine with aura, not intractable, without status migrainosus: Secondary | ICD-10-CM | POA: Diagnosis not present

## 2017-02-02 ENCOUNTER — Other Ambulatory Visit: Payer: Self-pay | Admitting: Physician Assistant

## 2017-02-02 ENCOUNTER — Other Ambulatory Visit: Payer: Self-pay | Admitting: Family Medicine

## 2017-02-02 DIAGNOSIS — R519 Headache, unspecified: Secondary | ICD-10-CM

## 2017-02-02 DIAGNOSIS — R51 Headache: Secondary | ICD-10-CM | POA: Diagnosis not present

## 2017-02-02 DIAGNOSIS — G8929 Other chronic pain: Secondary | ICD-10-CM

## 2017-02-02 DIAGNOSIS — Z681 Body mass index (BMI) 19 or less, adult: Secondary | ICD-10-CM | POA: Diagnosis not present

## 2017-02-02 DIAGNOSIS — M069 Rheumatoid arthritis, unspecified: Secondary | ICD-10-CM | POA: Diagnosis not present

## 2017-02-02 DIAGNOSIS — Z1389 Encounter for screening for other disorder: Secondary | ICD-10-CM | POA: Diagnosis not present

## 2017-02-02 DIAGNOSIS — J309 Allergic rhinitis, unspecified: Secondary | ICD-10-CM | POA: Diagnosis not present

## 2017-02-02 DIAGNOSIS — E43 Unspecified severe protein-calorie malnutrition: Secondary | ICD-10-CM | POA: Diagnosis not present

## 2017-02-06 ENCOUNTER — Ambulatory Visit
Admission: RE | Admit: 2017-02-06 | Discharge: 2017-02-06 | Disposition: A | Discharge status: Home / Self Care | Payer: Medicare HMO | Source: Ambulatory Visit | Attending: Family Medicine | Admitting: Family Medicine

## 2017-02-06 DIAGNOSIS — R519 Headache, unspecified: Secondary | ICD-10-CM

## 2017-02-06 DIAGNOSIS — G8929 Other chronic pain: Secondary | ICD-10-CM

## 2017-02-06 DIAGNOSIS — G43909 Migraine, unspecified, not intractable, without status migrainosus: Secondary | ICD-10-CM | POA: Diagnosis not present

## 2017-02-06 DIAGNOSIS — R51 Headache: Secondary | ICD-10-CM | POA: Diagnosis not present

## 2017-03-08 DIAGNOSIS — G43019 Migraine without aura, intractable, without status migrainosus: Secondary | ICD-10-CM | POA: Diagnosis not present

## 2017-05-15 DIAGNOSIS — Z23 Encounter for immunization: Secondary | ICD-10-CM | POA: Diagnosis not present

## 2017-06-04 DIAGNOSIS — M21611 Bunion of right foot: Secondary | ICD-10-CM | POA: Diagnosis not present

## 2017-06-04 DIAGNOSIS — L84 Corns and callosities: Secondary | ICD-10-CM | POA: Diagnosis not present

## 2017-06-04 DIAGNOSIS — M21621 Bunionette of right foot: Secondary | ICD-10-CM | POA: Diagnosis not present

## 2017-06-04 DIAGNOSIS — M21612 Bunion of left foot: Secondary | ICD-10-CM | POA: Diagnosis not present

## 2017-06-04 DIAGNOSIS — M21622 Bunionette of left foot: Secondary | ICD-10-CM | POA: Diagnosis not present

## 2017-06-08 DIAGNOSIS — R200 Anesthesia of skin: Secondary | ICD-10-CM | POA: Diagnosis not present

## 2017-06-26 DIAGNOSIS — H6123 Impacted cerumen, bilateral: Secondary | ICD-10-CM | POA: Diagnosis not present

## 2017-07-31 DIAGNOSIS — R69 Illness, unspecified: Secondary | ICD-10-CM | POA: Diagnosis not present

## 2017-09-26 DIAGNOSIS — G43909 Migraine, unspecified, not intractable, without status migrainosus: Secondary | ICD-10-CM | POA: Diagnosis not present

## 2017-09-26 DIAGNOSIS — M810 Age-related osteoporosis without current pathological fracture: Secondary | ICD-10-CM | POA: Diagnosis not present

## 2017-09-26 DIAGNOSIS — E559 Vitamin D deficiency, unspecified: Secondary | ICD-10-CM | POA: Diagnosis not present

## 2017-09-26 DIAGNOSIS — E46 Unspecified protein-calorie malnutrition: Secondary | ICD-10-CM | POA: Diagnosis not present

## 2017-09-26 DIAGNOSIS — Z136 Encounter for screening for cardiovascular disorders: Secondary | ICD-10-CM | POA: Diagnosis not present

## 2017-09-26 DIAGNOSIS — Z681 Body mass index (BMI) 19 or less, adult: Secondary | ICD-10-CM | POA: Diagnosis not present

## 2017-09-26 DIAGNOSIS — E538 Deficiency of other specified B group vitamins: Secondary | ICD-10-CM | POA: Diagnosis not present

## 2017-09-26 DIAGNOSIS — J309 Allergic rhinitis, unspecified: Secondary | ICD-10-CM | POA: Diagnosis not present

## 2017-09-26 DIAGNOSIS — M069 Rheumatoid arthritis, unspecified: Secondary | ICD-10-CM | POA: Diagnosis not present

## 2017-09-26 DIAGNOSIS — Z0000 Encounter for general adult medical examination without abnormal findings: Secondary | ICD-10-CM | POA: Diagnosis not present

## 2017-10-23 DIAGNOSIS — R69 Illness, unspecified: Secondary | ICD-10-CM | POA: Diagnosis not present

## 2017-10-26 DIAGNOSIS — M545 Low back pain: Secondary | ICD-10-CM | POA: Diagnosis not present

## 2017-11-01 DIAGNOSIS — E559 Vitamin D deficiency, unspecified: Secondary | ICD-10-CM | POA: Diagnosis not present

## 2017-11-07 DIAGNOSIS — H8110 Benign paroxysmal vertigo, unspecified ear: Secondary | ICD-10-CM | POA: Diagnosis not present

## 2017-11-07 DIAGNOSIS — R4182 Altered mental status, unspecified: Secondary | ICD-10-CM | POA: Diagnosis not present

## 2017-11-26 DIAGNOSIS — M810 Age-related osteoporosis without current pathological fracture: Secondary | ICD-10-CM | POA: Diagnosis not present

## 2017-11-28 DIAGNOSIS — N952 Postmenopausal atrophic vaginitis: Secondary | ICD-10-CM | POA: Diagnosis not present

## 2017-11-28 DIAGNOSIS — Z681 Body mass index (BMI) 19 or less, adult: Secondary | ICD-10-CM | POA: Diagnosis not present

## 2017-11-28 DIAGNOSIS — Z01419 Encounter for gynecological examination (general) (routine) without abnormal findings: Secondary | ICD-10-CM | POA: Diagnosis not present

## 2017-12-11 DIAGNOSIS — E559 Vitamin D deficiency, unspecified: Secondary | ICD-10-CM | POA: Diagnosis not present

## 2018-02-15 DIAGNOSIS — H938X1 Other specified disorders of right ear: Secondary | ICD-10-CM | POA: Diagnosis not present

## 2018-03-11 DIAGNOSIS — R69 Illness, unspecified: Secondary | ICD-10-CM | POA: Diagnosis not present

## 2018-04-11 DIAGNOSIS — H524 Presbyopia: Secondary | ICD-10-CM | POA: Diagnosis not present

## 2018-04-13 DIAGNOSIS — Z0100 Encounter for examination of eyes and vision without abnormal findings: Secondary | ICD-10-CM | POA: Diagnosis not present

## 2018-05-21 DIAGNOSIS — Z23 Encounter for immunization: Secondary | ICD-10-CM | POA: Diagnosis not present

## 2018-06-17 DIAGNOSIS — Z1389 Encounter for screening for other disorder: Secondary | ICD-10-CM | POA: Diagnosis not present

## 2018-06-17 DIAGNOSIS — Z681 Body mass index (BMI) 19 or less, adult: Secondary | ICD-10-CM | POA: Diagnosis not present

## 2018-06-17 DIAGNOSIS — E538 Deficiency of other specified B group vitamins: Secondary | ICD-10-CM | POA: Diagnosis not present

## 2018-06-17 DIAGNOSIS — R42 Dizziness and giddiness: Secondary | ICD-10-CM | POA: Diagnosis not present

## 2018-06-17 DIAGNOSIS — E46 Unspecified protein-calorie malnutrition: Secondary | ICD-10-CM | POA: Diagnosis not present

## 2018-06-17 DIAGNOSIS — E559 Vitamin D deficiency, unspecified: Secondary | ICD-10-CM | POA: Diagnosis not present

## 2018-06-25 DIAGNOSIS — R42 Dizziness and giddiness: Secondary | ICD-10-CM | POA: Diagnosis not present

## 2018-06-25 DIAGNOSIS — H9011 Conductive hearing loss, unilateral, right ear, with unrestricted hearing on the contralateral side: Secondary | ICD-10-CM | POA: Diagnosis not present

## 2018-06-25 DIAGNOSIS — H6121 Impacted cerumen, right ear: Secondary | ICD-10-CM | POA: Diagnosis not present

## 2018-10-10 DIAGNOSIS — R69 Illness, unspecified: Secondary | ICD-10-CM | POA: Diagnosis not present

## 2018-10-14 DIAGNOSIS — M545 Low back pain: Secondary | ICD-10-CM | POA: Diagnosis not present

## 2018-10-17 DIAGNOSIS — M47816 Spondylosis without myelopathy or radiculopathy, lumbar region: Secondary | ICD-10-CM | POA: Diagnosis not present

## 2018-10-21 DIAGNOSIS — M47816 Spondylosis without myelopathy or radiculopathy, lumbar region: Secondary | ICD-10-CM | POA: Diagnosis not present

## 2018-10-23 DIAGNOSIS — M47816 Spondylosis without myelopathy or radiculopathy, lumbar region: Secondary | ICD-10-CM | POA: Diagnosis not present

## 2018-10-28 DIAGNOSIS — M47816 Spondylosis without myelopathy or radiculopathy, lumbar region: Secondary | ICD-10-CM | POA: Diagnosis not present

## 2018-10-30 DIAGNOSIS — M47816 Spondylosis without myelopathy or radiculopathy, lumbar region: Secondary | ICD-10-CM | POA: Diagnosis not present

## 2018-11-04 DIAGNOSIS — M47816 Spondylosis without myelopathy or radiculopathy, lumbar region: Secondary | ICD-10-CM | POA: Diagnosis not present

## 2018-11-21 DIAGNOSIS — R69 Illness, unspecified: Secondary | ICD-10-CM | POA: Diagnosis not present

## 2019-05-07 DIAGNOSIS — Z23 Encounter for immunization: Secondary | ICD-10-CM | POA: Diagnosis not present

## 2019-07-08 ENCOUNTER — Other Ambulatory Visit: Payer: Self-pay

## 2019-07-08 DIAGNOSIS — Z20822 Contact with and (suspected) exposure to covid-19: Secondary | ICD-10-CM

## 2019-07-10 LAB — NOVEL CORONAVIRUS, NAA: SARS-CoV-2, NAA: NOT DETECTED

## 2019-09-25 ENCOUNTER — Ambulatory Visit: Payer: Self-pay | Attending: Internal Medicine

## 2019-09-25 DIAGNOSIS — Z23 Encounter for immunization: Secondary | ICD-10-CM | POA: Insufficient documentation

## 2019-09-25 NOTE — Progress Notes (Signed)
   Covid-19 Vaccination Clinic  Name:  Sarit Thakur    MRN: [id number] DOB: 1951-08-31  09/25/2019  Ms. Maxon was observed post Covid-19 immunization for 15 minutes without incidence. She was provided with Vaccine Information Sheet and instruction to access the V-Safe system.   Ms. Vandelinder was instructed to call 911 with any severe reactions post vaccine: Marland Kitchen Difficulty breathing  . Swelling of your face and throat  . A fast heartbeat  . A bad rash all over your body  . Dizziness and weakness    Immunizations Administered    Name Date Dose VIS Date Route   Pfizer COVID-19 Vaccine 09/25/2019 11:53 AM 0.3 mL 08/15/2019 Intramuscular   Manufacturer: [place]   Lot: [id number]   [place]: [id number]

## 2019-10-16 ENCOUNTER — Ambulatory Visit: Payer: Self-pay | Attending: Internal Medicine

## 2019-10-16 DIAGNOSIS — Z23 Encounter for immunization: Secondary | ICD-10-CM | POA: Insufficient documentation

## 2019-10-16 NOTE — Progress Notes (Signed)
   Covid-19 Vaccination Clinic  Name:  April Waters    MRN: [id number] DOB: Feb 13, 1951  10/16/2019  [name] was observed post Covid-19 immunization for 15 minutes without incidence. She was provided with Vaccine Information Sheet and instruction to access the V-Safe system.   [name] was instructed to call 911 with any severe reactions post vaccine: Marland Kitchen Difficulty breathing  . Swelling of your face and throat  . A fast heartbeat  . A bad rash all over your body  . Dizziness and weakness    Immunizations Administered    Name Date Dose VIS Date Route   Pfizer COVID-19 Vaccine 10/16/2019 11:52 AM 0.3 mL 08/15/2019 Intramuscular   Manufacturer: [place]   Lot: [id number]   [place]: [id number]

## 2020-03-11 ENCOUNTER — Other Ambulatory Visit: Payer: Self-pay | Admitting: Physician Assistant

## 2020-03-11 DIAGNOSIS — N644 Mastodynia: Secondary | ICD-10-CM

## 2020-03-29 ENCOUNTER — Other Ambulatory Visit: Payer: Self-pay

## 2020-03-29 ENCOUNTER — Ambulatory Visit: Admission: RE | Admit: 2020-03-29 | Payer: Self-pay | Source: Ambulatory Visit

## 2020-03-29 ENCOUNTER — Ambulatory Visit
Admission: RE | Admit: 2020-03-29 | Discharge: 2020-03-29 | Disposition: A | Discharge status: Home / Self Care | Payer: Medicare Other | Source: Ambulatory Visit | Attending: Physician Assistant | Admitting: Physician Assistant

## 2020-03-29 DIAGNOSIS — N644 Mastodynia: Secondary | ICD-10-CM

## 2020-06-01 ENCOUNTER — Telehealth: Payer: Self-pay

## 2020-06-01 ENCOUNTER — Ambulatory Visit (INDEPENDENT_AMBULATORY_CARE_PROVIDER_SITE_OTHER): Payer: Medicare Other | Admitting: Allergy and Immunology

## 2020-06-01 ENCOUNTER — Encounter: Payer: Self-pay | Admitting: Allergy and Immunology

## 2020-06-01 ENCOUNTER — Other Ambulatory Visit: Payer: Self-pay

## 2020-06-01 VITALS — BP 116/68 | HR 94 | Temp 98.5°F | Resp 18 | Ht 61.0 in | Wt 81.5 lb

## 2020-06-01 DIAGNOSIS — J301 Allergic rhinitis due to pollen: Secondary | ICD-10-CM

## 2020-06-01 DIAGNOSIS — K146 Glossodynia: Secondary | ICD-10-CM

## 2020-06-01 DIAGNOSIS — J3089 Other allergic rhinitis: Secondary | ICD-10-CM | POA: Diagnosis not present

## 2020-06-01 DIAGNOSIS — R1084 Generalized abdominal pain: Secondary | ICD-10-CM

## 2020-06-01 DIAGNOSIS — T7800XA Anaphylactic reaction due to unspecified food, initial encounter: Secondary | ICD-10-CM

## 2020-06-01 DIAGNOSIS — T781XXD Other adverse food reactions, not elsewhere classified, subsequent encounter: Secondary | ICD-10-CM

## 2020-06-01 DIAGNOSIS — J4520 Mild intermittent asthma, uncomplicated: Secondary | ICD-10-CM | POA: Diagnosis not present

## 2020-06-01 NOTE — Progress Notes (Signed)
[place] - [place] - [place] - [place] - [place]   Dear Dr. Alyson Ingles,  Thank you for referring April Waters to the [facility] on 06/01/2020.   Below is a summation of this patient's evaluation and recommendations.  Thank you for your referral. I will keep you informed about this patient's response to treatment.   If you have any questions please do not hesitate to contact me.   Sincerely,  Jiles Prows, MD Allergy / Immunology [place]   ______________________________________________________________________    NEW PATIENT NOTE  Referring Provider: Maury Dus, MD Primary Provider: Maury Dus, MD Date of office visit: 06/01/2020    Subjective:   Chief Complaint:  April Waters (DOB: [birth year]) is a 69 y.o. female who presents to the clinic on 06/01/2020 with a chief complaint of Food Intolerance, Asthma, and Allergic Rhinitis  .     HPI: April Waters presents to this clinic in evaluation of food allergy and food intolerance.  She has a long history of "food allergy" dating back many years which appears to have improved somewhat over the course of the past several decades.  The last time she was skin tested was about 20 years ago when she was found to be allergic to oats.  What she has found over the course of the past several years is that she develops recurrent intense abdominal pain usually located in the central part of her abdomen within 10 minutes of eating foods.  Most of the foods that give rise to this issue include sweet potato, yellow squash, zucchini, apples.  Basically there is a lot of foods that she does not eat because she has fear of developing this problem with abdominal pain and gas.  She will also developed very bad diarrhea with the yellow squash exposure.  Currently she is eating papaya and bananas and a vegetable protein shake and Keffer which is lactose-free  and egg.  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  She gets burning in her mouth when she eats foods and when she gets exposed to pollens.  She also has a history of migraines which apparently is under pretty good control as long as she remains away from mold exposure.  She also has a history of asthma as a child which for the most part has become inactive.  She was given a short acting bronchodilator back in 2017 and she rarely uses this medication and has not required a systemic steroid to treat an exacerbation of asthma in decades.  She has received 3 Pfizer Covid vaccinations.  Past Medical History:  Diagnosis Date  . Allergy   . Asthma   . C. difficile enteritis   . Chicken pox   . Common migraine with intractable migraine 05/26/2015  . Randell Patient infection 1986   lasted about a year late diagnosis  . Measles   . Osteoporosis   . Rheumatoid arthritis [facility])     Past Surgical History:  Procedure Laterality Date  . [place]   right  . cebaceous cyst     right breast - age 75  . fluid knee  1994   left  . HERNIA REPAIR     bilateral inguinal 2 months  . [place]  . KNEE SURGERY  1990   medial meniscous right  . LAPAROSCOPY  1993   endometriosis  .  nasal polyps     age 74    Allergies as of 06/01/2020      Reactions   Cardizem [diltiazem Hcl]    Codeine    Penicillins    Vicodin [hydrocodone-acetaminophen] Other (See Comments)   Muscle Weakness      Medication List    acetaminophen 500 MG tablet Commonly known as: TYLENOL Take 500 mg by mouth every 6 (six) hours as needed. Reported on 11/29/2015   ascorbic acid 500 MG tablet Commonly known as: VITAMIN C Take 500 mg by mouth daily.   azelastine 0.1 % nasal spray Commonly known as: ASTELIN Place 1 spray into both nostrils 2 (two) times daily. Use in each nostril as directed   CALCIUM-VITAMIN D PO Take 300 mg by mouth daily.   ibuprofen  400 MG tablet Commonly known as: ADVIL Take 400 mg by mouth every 6 (six) hours as needed.   levalbuterol 45 MCG/ACT inhaler Commonly known as: Xopenex HFA Inhale 2 puffs into the lungs every 6 (six) hours as needed for wheezing (cough).   Magnesium 300 MG Caps Take 1 capsule by mouth daily.   VITAMIN K PO Take 30 mcg by mouth daily.       Review of systems negative except as noted in HPI / PMHx or noted below:  Review of Systems  Constitutional: Negative.   HENT: Negative.   Eyes: Negative.   Respiratory: Negative.   Cardiovascular: Negative.   Gastrointestinal: Negative.   Genitourinary: Negative.   Musculoskeletal: Negative.   Skin: Negative.   Neurological: Negative.   Endo/Heme/Allergies: Negative.   Psychiatric/Behavioral: Negative.     Family History  Problem Relation Age of Onset  . Hypertension Mother   . Diabetes Mother   . Migraines Mother   . Heart attack Father   . Lung cancer Father   . Allergic rhinitis Sister   . Asthma Sister   . Allergic rhinitis Sister   . Colon cancer Maternal Grandmother   . Angioedema Neg Hx   . Eczema Neg Hx   . Immunodeficiency Neg Hx   . Urticaria Neg Hx   . Atopy Neg Hx     Social History   Socioeconomic History  . Marital status: Married    Spouse name: Not on file  . Number of children: 0  . Years of education: Therapist, sports  . Highest education level: Not on file  Occupational History  . Occupation: retired  Tobacco Use  . Smoking status: Former Smoker    Types: Cigarettes    Quit date: 09/04/1984    Years since quitting: 35.7  . Smokeless tobacco: Never Used  Vaping Use  . Vaping Use: Never used  Substance and Sexual Activity  . Alcohol use: No    Alcohol/week: 0.0 standard drinks  . Drug use: No  . Sexual activity: Not on file  Other Topics Concern  . Not on file  Social History Narrative   Patient drinks 1 cup of caffeine daily.   Patient is right handed.    Environmental and Social history  Lives  in a house with a dry environment, a dog located inside the household, no carpet in the bedroom, no plastic on the bed, no plastic on the pillow, no smoking ongoing with inside the household.  Objective:   Vitals:   06/01/20 1440  BP: 116/68  Pulse: 94  Resp: 18  Temp: 98.5 F (36.9 C)  SpO2: 97%   Height: 5\' 1"  (154.9 cm) Weight: 81 lb 8  oz (37 kg)  Physical Exam Constitutional:      Appearance: She is not diaphoretic.  HENT:     Head: Normocephalic.     Right Ear: Tympanic membrane, ear canal and external ear normal.     Left Ear: Tympanic membrane, ear canal and external ear normal.     Nose: Nose normal. No mucosal edema or rhinorrhea.     Mouth/Throat:     Pharynx: Uvula midline. No oropharyngeal exudate.  Eyes:     Conjunctiva/sclera: Conjunctivae normal.  Neck:     Thyroid: No thyromegaly.     Trachea: Trachea normal. No tracheal tenderness or tracheal deviation.  Cardiovascular:     Rate and Rhythm: Normal rate and regular rhythm.     Heart sounds: Normal heart sounds, S1 normal and S2 normal. No murmur heard.   Pulmonary:     Effort: No respiratory distress.     Breath sounds: Normal breath sounds. No stridor. No wheezing or rales.  Lymphadenopathy:     Head:     Right side of head: No tonsillar adenopathy.     Left side of head: No tonsillar adenopathy.     Cervical: No cervical adenopathy.  Skin:    Findings: No erythema or rash.     Nails: There is no clubbing.  Neurological:     Mental Status: She is alert.     Diagnostics: Allergy skin tests were performed.  She demonstrated hypersensitivity against trees, grasses, weeds. In addition she demonstrated hypersensitivity against pistachio and oat  Spirometry was performed and demonstrated an FEV1 of 2.49 @ 126 % of predicted. FEV1/FVC = 0.81  The patient had an Asthma Control Test with the following results: ACT Total Score: 20.    Assessment and Plan:    1. Allergy with anaphylaxis due to food     2. Pollen-food allergy, subsequent encounter   3. Asthma, mild intermittent, well-controlled   4. Perennial allergic rhinitis   5. Seasonal allergic rhinitis due to pollen   6. Burning mouth syndrome   7. Intermittent generalized abdominal pain     1.  Allergen avoidance measures   2.  If needed:   A. Xopenx HFA - 2 inhalations every 4-6 hours  B. OTC antihistamine - claritin / zyrtec 10 mg - 1 tablet 1 time per day  C. Epi-pen, benadryl, MD/ER evalution for allergic reaction.  3. Food challenge? Further evaluation?  4. Evaluation with GI for recurrent postprandial abdominal pain  5. Obtain fall flu vaccine  Davin has a atopic immune system and she has oral allergy syndrome and occasionally appears to develop a more systemic form of immune reaction to specific food products.  She will need to work through the specific foods that give rise to her GI and burning mouth issues and compile a list of foods that she can eat without any difficulty.  I suspect that most uncooked fruits and vegetables will probably precipitate this issue and if she cooks these foods then the heat labile pollen like protein should breakdown and she should be able to consume these foods.  In addition, she can utilize antihistamines and albuterol on an as-needed basis for her respiratory tract atopic disease.  And she needs further evaluation with gastroenterology for recurrent postprandial abdominal pain as there may be some other etiologic agent contributing to this issue.  She will keep in contact with me as she moves forward with this plan and we will make a determination about whether or not she  requires further evaluation or treatment for this condition.  She would be a candidate for immunotherapy directed against pollens which would diminish her oral allergy syndrome state.  Jiles Prows, MD Allergy / Immunology [facility] [place]

## 2020-06-01 NOTE — Telephone Encounter (Signed)
Ambulatory referral to GI for postprandial abdominal pain. Please schedule appointment.

## 2020-06-01 NOTE — Patient Instructions (Addendum)
  1.  Allergen avoidance measures?  2.  If needed:   A. Xopenx HFA - 2 inhalations every 4-6 hours  B. OTC antihistamine - claritin / zyrtec 10 mg - 1 tablet 1 time per day  C. Epi-pen, benadryl, MD/ER evalution for allergic reaction.  3. Food challenge? Further evaluation?  4. Evaluation with GI for recurrent postprandial abdominal pain  5. Obtain fall flu vaccine

## 2020-06-02 ENCOUNTER — Telehealth: Payer: Self-pay | Admitting: Allergy and Immunology

## 2020-06-02 ENCOUNTER — Other Ambulatory Visit: Payer: Self-pay | Admitting: *Deleted

## 2020-06-02 MED ORDER — EPINEPHRINE 0.3 MG/0.3ML IJ SOAJ: 0.3000 mg | Freq: Once | INTRAMUSCULAR | 1 refills | Status: AC

## 2020-06-02 MED ORDER — LEVALBUTEROL TARTRATE 45 MCG/ACT IN AERO: 2.0000 | INHALATION_SPRAY | Freq: Four times a day (QID) | RESPIRATORY_TRACT | 1 refills | Status: DC | PRN

## 2020-06-02 NOTE — Telephone Encounter (Signed)
PA has been approved for Levalbuterol HFA. PA approval form has been faxed to patient's pharmacy, labeled, and placed in bulk scanning.

## 2020-06-02 NOTE — Telephone Encounter (Signed)
Patient called back and wanted to thank the nurses for getting her inhaler approved and wanted to thank all the staff for being so wonderful. FYI

## 2020-06-02 NOTE — Telephone Encounter (Signed)
FYI

## 2020-06-02 NOTE — Telephone Encounter (Signed)
Patient called and states Epi Pen was not called in to [facility] from yesterday.  Please advise.

## 2020-06-02 NOTE — Telephone Encounter (Signed)
EpiPen has been sent in. Janett Billow called the patient and informed. Obtained Rx information in order to do prior authorization for Levalbuterol HFA. ID # is [id number]. [id number]. [id number]. Group #-[id number]. PA for Levalbuterol HFA has been submitted through CoverMyMeds and is currently pending approval or denial.

## 2020-06-03 ENCOUNTER — Encounter: Payer: Self-pay | Admitting: Allergy and Immunology

## 2020-06-03 ENCOUNTER — Encounter: Payer: Self-pay | Admitting: Gastroenterology

## 2020-06-03 NOTE — Telephone Encounter (Signed)
Patient is scheduled for 07/27/2020 @ 10 to see Dr Fuller Plan. Patient has been informed of this information.  Thanks

## 2020-06-16 NOTE — Telephone Encounter (Signed)
Thank you April Waters

## 2020-07-05 DIAGNOSIS — M8580 Other specified disorders of bone density and structure, unspecified site: Secondary | ICD-10-CM | POA: Insufficient documentation

## 2020-07-05 DIAGNOSIS — M810 Age-related osteoporosis without current pathological fracture: Secondary | ICD-10-CM | POA: Insufficient documentation

## 2020-07-07 ENCOUNTER — Telehealth: Payer: Self-pay | Admitting: Allergy and Immunology

## 2020-07-07 NOTE — Telephone Encounter (Signed)
Please advise 

## 2020-07-07 NOTE — Telephone Encounter (Signed)
Patient informed of Dr. Bruna Potter advice.

## 2020-07-07 NOTE — Telephone Encounter (Signed)
Please inform April Waters that if she is truly much better without any GI issues at all and she can probably delay the visit with the GI doctor.  I would suggest that she waits until a week before her visit to cancel that appointment and we can see if she does develop any GI symptoms during the interval that would make her complete that evaluation.

## 2020-07-07 NOTE — Telephone Encounter (Signed)
Patient called and said that we have scheduled her to see a GI in nov. 23 but she has change her eating and she is not having any stomach problems but she was not sure if she should cancelled. So she would like you to tell her what to do [phone].

## 2020-07-27 ENCOUNTER — Ambulatory Visit (INDEPENDENT_AMBULATORY_CARE_PROVIDER_SITE_OTHER): Payer: Medicare Other | Admitting: Gastroenterology

## 2020-07-27 ENCOUNTER — Other Ambulatory Visit: Payer: Self-pay

## 2020-07-27 ENCOUNTER — Encounter: Payer: Self-pay | Admitting: Gastroenterology

## 2020-07-27 ENCOUNTER — Other Ambulatory Visit (INDEPENDENT_AMBULATORY_CARE_PROVIDER_SITE_OTHER): Payer: Medicare Other

## 2020-07-27 VITALS — BP 110/60 | HR 72 | Ht 63.0 in | Wt 83.0 lb

## 2020-07-27 DIAGNOSIS — R634 Abnormal weight loss: Secondary | ICD-10-CM

## 2020-07-27 DIAGNOSIS — R1084 Generalized abdominal pain: Secondary | ICD-10-CM

## 2020-07-27 LAB — CBC WITH DIFFERENTIAL/PLATELET
Basophils Absolute: 0 10*3/uL (ref 0.0–0.1)
Basophils Relative: 0.9 % (ref 0.0–3.0)
Eosinophils Absolute: 0.1 10*3/uL (ref 0.0–0.7)
Eosinophils Relative: 2.7 % (ref 0.0–5.0)
HCT: 42.8 % (ref 36.0–46.0)
Hemoglobin: 14.9 g/dL (ref 12.0–15.0)
Lymphocytes Relative: 19.2 % (ref 12.0–46.0)
Lymphs Abs: 0.9 10*3/uL (ref 0.7–4.0)
MCHC: 34.9 g/dL (ref 30.0–36.0)
MCV: 93.6 fl (ref 78.0–100.0)
Monocytes Absolute: 0.5 10*3/uL (ref 0.1–1.0)
Monocytes Relative: 10.8 % (ref 3.0–12.0)
Neutro Abs: 3.2 10*3/uL (ref 1.4–7.7)
Neutrophils Relative %: 66.4 % (ref 43.0–77.0)
Platelets: 341 10*3/uL (ref 150.0–400.0)
RBC: 4.57 Mil/uL (ref 3.87–5.11)
RDW: 12.2 % (ref 11.5–15.5)
WBC: 4.8 10*3/uL (ref 4.0–10.5)

## 2020-07-27 LAB — COMPREHENSIVE METABOLIC PANEL
ALT: 27 U/L (ref 0–35)
AST: 27 U/L (ref 0–37)
Albumin: 4.6 g/dL (ref 3.5–5.2)
Alkaline Phosphatase: 46 U/L (ref 39–117)
BUN: 17 mg/dL (ref 6–23)
CO2: 30 mEq/L (ref 19–32)
Calcium: 10 mg/dL (ref 8.4–10.5)
Chloride: 96 mEq/L (ref 96–112)
Creatinine, Ser: 0.58 mg/dL (ref 0.40–1.20)
GFR: 92.87 mL/min (ref >60.00)
Glucose, Bld: 90 mg/dL (ref 70–99)
Potassium: 5.2 mEq/L — ABNORMAL HIGH (ref 3.5–5.1)
Sodium: 132 mEq/L — ABNORMAL LOW (ref 135–145)
Total Bilirubin: 0.6 mg/dL (ref 0.2–1.2)
Total Protein: 7.5 g/dL (ref 6.0–8.3)

## 2020-07-27 LAB — TSH: TSH: 1.83 u[IU]/mL (ref 0.35–4.50)

## 2020-07-27 NOTE — Progress Notes (Addendum)
History of Present Illness: This is a 69 year old female referred by Maury Dus, MD and Lerry Paterson, MD for the evaluation of post prandial abdominal pain, diarrhea, weight loss. She relates difficulties with squash and other foods resulting crampy generalized postprandial abdominal pain, diarrhea and weight loss for several weeks. She has made modifications to her diet eliminating squash and other foods with a substantial improvement in her symptoms. Prior to that she was eliminating foods from her diet to deal with migraine headaches. She has of substantially restricted diet and has had a gradual weight loss on this diet. She has noted a 3 pound weight gain since discontinuing squash and similar foods and resuming some lactose products. She states her highest adult weights were in the 90 to 95 pound range. She is currently 83 pounds with a BMI of 14.7. She has been followed by Dr. Michail Sermon at [facility] however she is now referred here. She relates a history of liver cyst found on prior imaging studies. She relates a history of a colonoscopy by Dr. Michail Sermon in 2015 that she believes was normal. Denies constipation, change in stool caliber, melena, hematochezia, nausea, vomiting, dysphagia, reflux symptoms, chest pain.   Abd [place] 07/13/2014 IMPRESSION: 1.  Multiple hepatic cysts. 2.  No evidence for acute  abnormality.     Allergies  Allergen Reactions  . Cardizem [Diltiazem Hcl]   . Codeine   . Penicillins   . Vicodin [Hydrocodone-Acetaminophen] Other (See Comments)    Muscle Weakness   Outpatient Medications Prior to Visit  Medication Sig Dispense Refill  . acetaminophen (TYLENOL) 500 MG tablet Take 500 mg by mouth every 6 (six) hours as needed. Reported on 11/29/2015    . ascorbic acid (VITAMIN C) 500 MG tablet Take 500 mg by mouth daily.    Marland Kitchen CALCIUM-VITAMIN D PO Take 300 mg by mouth daily.    Marland Kitchen EPINEPHrine 0.3 mg/0.3 mL IJ SOAJ injection Inject 3 mLs into the skin as needed.    .  levalbuterol (XOPENEX HFA) 45 MCG/ACT inhaler Inhale 2 puffs into the lungs every 6 (six) hours as needed for wheezing (cough). 1 each 1  . Magnesium 300 MG CAPS Take 1 capsule by mouth daily.    Marland Kitchen VITAMIN K PO Take 30 mcg by mouth daily.    Marland Kitchen azelastine (ASTELIN) 0.1 % nasal spray Place 1 spray into both nostrils 2 (two) times daily. Use in each nostril as directed (Patient not taking: Reported on 06/01/2020) 30 mL 5  . ibuprofen (ADVIL,MOTRIN) 400 MG tablet Take 400 mg by mouth every 6 (six) hours as needed. (Patient not taking: Reported on 06/01/2020)     No facility-administered medications prior to visit.   Past Medical History:  Diagnosis Date  . Allergy   . Asthma   . C. difficile enteritis   . Chicken pox   . Common migraine with intractable migraine 05/26/2015  . Randell Patient infection 1986   lasted about a year late diagnosis  . Measles   . Osteoporosis   . Rheumatoid arthritis [facility])    Past Surgical History:  Procedure Laterality Date  . [place]   right  . cebaceous cyst     right breast - age 73  . fluid knee  1994   left  . HERNIA REPAIR     bilateral inguinal 2 months  . [place]  . KNEE SURGERY  1990   medial meniscous  right  . LAPAROSCOPY  1993   endometriosis  . nasal polyps     age 89   Social History   Socioeconomic History  . Marital status: Married    Spouse name: Not on file  . Number of children: 0  . Years of education: Therapist, sports  . Highest education level: Not on file  Occupational History  . Occupation: retired  Tobacco Use  . Smoking status: Former Smoker    Types: Cigarettes    Quit date: 09/04/1984    Years since quitting: 35.9  . Smokeless tobacco: Never Used  Vaping Use  . Vaping Use: Never used  Substance and Sexual Activity  . Alcohol use: No    Alcohol/week: 0.0 standard drinks  . Drug use: No  . Sexual activity: Not on file  Other Topics Concern  . Not on file  Social History  Narrative   Patient drinks 1 cup of caffeine daily.   Patient is right handed.   Social Determinants of Health   Financial Resource Strain:   . Difficulty of Paying Living Expenses: Not on file  Food Insecurity:   . Worried About Charity fundraiser in the Last Year: Not on file  . Ran Out of Food in the Last Year: Not on file  Transportation Needs:   . Lack of Transportation (Medical): Not on file  . Lack of Transportation (Non-Medical): Not on file  Physical Activity:   . Days of Exercise per Week: Not on file  . Minutes of Exercise per Session: Not on file  Stress:   . Feeling of Stress : Not on file  Social Connections:   . Frequency of Communication with Friends and Family: Not on file  . Frequency of Social Gatherings with Friends and Family: Not on file  . Attends Religious Services: Not on file  . Active Member of Clubs or Organizations: Not on file  . Attends Archivist Meetings: Not on file  . Marital Status: Not on file   Family History  Problem Relation Age of Onset  . Hypertension Mother   . Diabetes Mother   . Migraines Mother   . Heart attack Father   . Lung cancer Father   . Allergic rhinitis Sister   . Asthma Sister   . Allergic rhinitis Sister   . Colon cancer Maternal Grandmother   . Angioedema Neg Hx   . Eczema Neg Hx   . Immunodeficiency Neg Hx   . Urticaria Neg Hx   . Atopy Neg Hx       Review of Systems: Pertinent positive and negative review of systems were noted in the above HPI section. All other review of systems were otherwise negative.    Physical Exam: General: Well developed, very thin, no acute distress Head: Normocephalic and atraumatic Eyes:  sclerae anicteric, EOMI Ears: Normal auditory acuity Mouth: Not examined, mask on during Covid-19 pandemic Neck: Supple, no masses or thyromegaly Lungs: Clear throughout to auscultation Heart: Regular rate and rhythm; no murmurs, rubs or bruits Abdomen: Soft, non tender and  non distended. No masses, hepatosplenomegaly or hernias noted. Normal Bowel sounds Rectal: Not done Musculoskeletal: Symmetrical with no gross deformities  Skin: No lesions on visible extremities Pulses:  Normal pulses noted Extremities: No clubbing, cyanosis, edema or deformities noted Neurological: Alert oriented x 4, grossly nonfocal Cervical Nodes:  No significant cervical adenopathy Inguinal Nodes: No significant inguinal adenopathy Psychological:  Alert and cooperative. Normal mood and affect   Assessment and Recommendations:  1.  Postprandial generalized abdominal pain, diarrhea, weight loss. Her symptoms have improved since discontinuing squash and a few other foods. She has had a modest weight gain of 3 pounds. Rule out celiac disease, malabsorption, food intolerances, occult neoplasm. CMP, CBC, TSH, tTG, IgA today. Schedule CT AP. I offered beginning an antispasmodic to help with symptoms. She states she is very sensitive to medications with frequent side effects and would prefer that no medications be prescribed. She is comfortable that her dietary adjustments have improved her symptoms. Request records from Dr. Kathline Magic office. REV in 6 weeks.   2. Multiple small benign hepatic cysts. Unless they have substantially enlarge they are likely asymptomatic.    cc: Maury Dus, MD [address],  [place] [address]

## 2020-07-27 NOTE — Patient Instructions (Signed)
Your provider has requested that you go to the basement level for lab work before leaving today. Press "B" on the elevator. The lab is located at the first door on the left as you exit the elevator.  You have been scheduled for a CT scan of the abdomen and pelvis at [facility], 1st floor Radiology. You are scheduled on 08/09/20  at 2:30pm. You should arrive 15 minutes prior to your appointment time for registration.  Please pick up 2 bottles of contrast from [place] at least 3 days prior to your scan. The solution may taste better if refrigerated, but do NOT add ice or any other liquid to this solution. Shake well before drinking.   Please follow the written instructions below on the day of your exam:   1) Do not eat anything after 10:30am (4 hours prior to your test)   2) Drink 1 bottle of contrast @ 12:30pm (2 hours prior to your exam)  Remember to shake well before drinking and do NOT pour over ice.     Drink 1 bottle of contrast @ 1:30pm (1 hour prior to your exam)   You may take any medications as prescribed with a small amount of water, if necessary. If you take any of the following medications: METFORMIN, GLUCOPHAGE, GLUCOVANCE, AVANDAMET, RIOMET, FORTAMET, [place] MET, JANUMET, GLUMETZA or METAGLIP, you MAY be asked to HOLD this medication 48 hours AFTER the exam.   The purpose of you drinking the oral contrast is to aid in the visualization of your intestinal tract. The contrast solution may cause some diarrhea. Depending on your individual set of symptoms, you may also receive an intravenous injection of x-ray contrast/dye. Plan on being at [facility] for 45 minutes or longer, depending on the type of exam you are having performed.   If you have any questions regarding your exam or if you need to reschedule, you may call [facility] at [phone] between the hours of 8:00 am and 5:00 pm, Monday-Friday.    Due to recent changes in healthcare laws, you may see the  results of your imaging and laboratory studies on MyChart before your provider has had a chance to review them.  We understand that in some cases there may be results that are confusing or concerning to you. Not all laboratory results come back in the same time frame and the provider may be waiting for multiple results in order to interpret others.  Please give [place] 48 hours in order for your provider to thoroughly review all the results before contacting the office for clarification of your results.   Thank you for choosing me and [facility].  Pricilla Riffle. Dagoberto Ligas., MD., [facility]

## 2020-07-28 LAB — IGA: Immunoglobulin A: 170 mg/dL (ref 70–320)

## 2020-07-28 LAB — TISSUE TRANSGLUTAMINASE, IGA: (tTG) Ab, IgA: <1 U/mL

## 2020-08-02 ENCOUNTER — Other Ambulatory Visit (INDEPENDENT_AMBULATORY_CARE_PROVIDER_SITE_OTHER): Payer: Medicare Other

## 2020-08-02 ENCOUNTER — Other Ambulatory Visit: Payer: Self-pay

## 2020-08-02 DIAGNOSIS — E875 Hyperkalemia: Secondary | ICD-10-CM

## 2020-08-02 DIAGNOSIS — E871 Hypo-osmolality and hyponatremia: Secondary | ICD-10-CM | POA: Diagnosis not present

## 2020-08-02 LAB — BASIC METABOLIC PANEL
BUN: 23 mg/dL (ref 6–23)
CO2: 28 mEq/L (ref 19–32)
Calcium: 9.4 mg/dL (ref 8.4–10.5)
Chloride: 96 mEq/L (ref 96–112)
Creatinine, Ser: 0.61 mg/dL (ref 0.40–1.20)
GFR: 91.73 mL/min (ref >60.00)
Glucose, Bld: 87 mg/dL (ref 70–99)
Potassium: 4.8 mEq/L (ref 3.5–5.1)
Sodium: 131 mEq/L — ABNORMAL LOW (ref 135–145)

## 2020-08-09 ENCOUNTER — Ambulatory Visit (HOSPITAL_COMMUNITY)
Admission: RE | Admit: 2020-08-09 | Discharge: 2020-08-09 | Disposition: A | Discharge status: Home / Self Care | Payer: Medicare Other | Source: Ambulatory Visit | Attending: Gastroenterology | Admitting: Gastroenterology

## 2020-08-09 ENCOUNTER — Other Ambulatory Visit: Payer: Self-pay

## 2020-08-09 DIAGNOSIS — R1084 Generalized abdominal pain: Secondary | ICD-10-CM | POA: Diagnosis present

## 2020-08-09 DIAGNOSIS — R634 Abnormal weight loss: Secondary | ICD-10-CM | POA: Diagnosis present

## 2020-08-09 MED ORDER — IOHEXOL 300 MG/ML  SOLN
100.0000 mL | Freq: Once | INTRAMUSCULAR | Status: AC | PRN
  Administered 2020-08-09: 80 mL via INTRAVENOUS

## 2020-08-10 ENCOUNTER — Other Ambulatory Visit: Payer: Self-pay

## 2020-08-10 DIAGNOSIS — R911 Solitary pulmonary nodule: Secondary | ICD-10-CM

## 2020-08-25 ENCOUNTER — Encounter: Payer: Self-pay | Admitting: Emergency Medicine

## 2020-08-25 ENCOUNTER — Ambulatory Visit: Payer: Medicare Other | Admitting: Dermatology

## 2020-08-25 ENCOUNTER — Other Ambulatory Visit: Payer: Self-pay

## 2020-08-25 ENCOUNTER — Ambulatory Visit (INDEPENDENT_AMBULATORY_CARE_PROVIDER_SITE_OTHER): Payer: Medicare Other | Admitting: Emergency Medicine

## 2020-08-25 DIAGNOSIS — J4520 Mild intermittent asthma, uncomplicated: Secondary | ICD-10-CM

## 2020-08-25 DIAGNOSIS — R911 Solitary pulmonary nodule: Secondary | ICD-10-CM | POA: Diagnosis not present

## 2020-08-25 NOTE — Addendum Note (Signed)
Addended by: Lorretta Harp on: 08/25/2020 03:38 PM   Modules accepted: Orders

## 2020-08-25 NOTE — Progress Notes (Signed)
Subjective:    Patient ID: April Waters, female    DOB: [birth year], 69 y.o.   MRN: [id number]  HPI 69 year old former smoker (8 pack years) with a history of rheumatoid arthritis, migraines, allergic rhinitis, childhood asthma. She has had increased allergy symptoms for the last 9 years since moving to - she had an allergy eval and was dx with burning mouth syndrome. Her abd sx improved significantly after modifying her diet based on the allergy testing. She also has some occasional wheezing since she moved to [facility].   She is referred today for a pulmonary nodule that was seen spuriously on CT of the abdomen done on 08/10/2020  CT scan of the abdomen and pelvis 08/10/2020 reviewed by me, shows no acute process in the abdomen, a possible 4 mm left lung base pulmonary nodule. There is some hazy medial R basilar infiltrate  Family history: Her father had lung Cancer   Review of Systems As per HPI  Past Medical History:  Diagnosis Date  . Allergy   . Asthma   . C. difficile enteritis   . Chicken pox   . Common migraine with intractable migraine 05/26/2015  . Randell Patient infection 1986   lasted about a year late diagnosis  . Measles   . Osteoporosis   . Rheumatoid arthritis ([place])      Family History  Problem Relation Age of Onset  . Hypertension Mother   . Diabetes Mother   . Migraines Mother   . Heart attack Father   . Lung cancer Father   . Allergic rhinitis Sister   . Asthma Sister   . Allergic rhinitis Sister   . Colon cancer Maternal Grandmother   . Angioedema Neg Hx   . Eczema Neg Hx   . Immunodeficiency Neg Hx   . Urticaria Neg Hx   . Atopy Neg Hx     Father >> TB, lung Cancer  Social History   Socioeconomic History  . Marital status: Married    Spouse name: Not on file  . Number of children: 0  . Years of education: Therapist, sports  . Highest education level: Not on file  Occupational History  . Occupation: retired  Tobacco Use  . Smoking status: Former Smoker     Types: Cigarettes    Quit date: 09/04/1984    Years since quitting: 35.9  . Smokeless tobacco: Never Used  Vaping Use  . Vaping Use: Never used  Substance and Sexual Activity  . Alcohol use: No    Alcohol/week: 0.0 standard drinks  . Drug use: No  . Sexual activity: Not on file  Other Topics Concern  . Not on file  Social History Narrative   Patient drinks 1 cup of caffeine daily.   Patient is right handed.   Social Determinants of Health   Financial Resource Strain: Not on file  Food Insecurity: Not on file  Transportation Needs: Not on file  Physical Activity: Not on file  Stress: Not on file  Social Connections: Not on file  Intimate Partner Violence: Not on file    Originally from [place], Raised in [place]. Has lived in [place] as well Moved to [place] from [place] in 2011. 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   Allergies  Allergen Reactions  . Cardizem [Diltiazem Hcl]   . Codeine   . Penicillins   . Vicodin [Hydrocodone-Acetaminophen] Other (See Comments)    Muscle Weakness     Outpatient Medications  Prior to Visit  Medication Sig Dispense Refill  . acetaminophen (TYLENOL) 500 MG tablet Take 500 mg by mouth every 6 (six) hours as needed. Reported on 11/29/2015    . ascorbic acid (VITAMIN C) 500 MG tablet Take 500 mg by mouth daily.    Marland Kitchen CALCIUM-VITAMIN D PO Take 300 mg by mouth daily.    Marland Kitchen levalbuterol (XOPENEX HFA) 45 MCG/ACT inhaler Inhale 2 puffs into the lungs every 6 (six) hours as needed for wheezing (cough). 1 each 1  . Magnesium 300 MG CAPS Take 1 capsule by mouth daily.    Marland Kitchen VITAMIN K PO Take 30 mcg by mouth daily.    Marland Kitchen EPINEPHrine 0.3 mg/0.3 mL IJ SOAJ injection Inject 3 mLs into the skin as needed. (Patient not taking: Reported on 08/25/2020)     No facility-administered medications prior to visit.        Objective:   Physical Exam  Vitals:   08/25/20 1453  BP: 114/64  Pulse: 97  SpO2: 97%  Weight: 81 lb (36.7 kg)  Height:  5\' 3"  (1.6 m)   Gen: Pleasant, very thin woman, in no distress,  normal affect  ENT: No lesions,  mouth clear,  oropharynx clear, no postnasal drip  Neck: No JVD, no stridor  Lungs: No use of accessory muscles, no crackles or wheezing on normal respiration, no wheeze on forced expiration  Cardiovascular: RRR, heart sounds normal, no murmur or gallops, no peripheral edema  Musculoskeletal: joint changes from RA on both hands.   Neuro: alert, awake, non focal  Skin: Warm, no lesions or rash      Assessment & Plan:  Mild intermittent asthma History of childhood asthma.  She has significant allergic disease to environment exposures also foods.  She may wheeze at times.  She has reassuring spirometry from when she was seen with Dr. Neldon Mc.  We will perform full PFT to better define any residual obstruction that may be present.  No indication for bronchodilators at this time  Pulmonary nodule That was seen on CT abdomen.  She is intermediate risk due to her family history of lung cancer, 8 pack-year smoking.  Suspect a benign cause, particularly given her rheumatoid disease, not on immunologic medication.  We will perform a dedicated CT chest to characterize any other nodules that may be present, determine appropriate follow-up timing.  If the 4 mm nodule is the only abnormality then we would plan to repeat in 1 year.  Baltazar Apo, MD, PhD 08/25/2020, 3:32 PM [facility] [phone] or if no answer [phone]

## 2020-08-25 NOTE — Assessment & Plan Note (Signed)
History of childhood asthma.  She has significant allergic disease to environment exposures also foods.  She may wheeze at times.  She has reassuring spirometry from when she was seen with Dr. Neldon Mc.  We will perform full PFT to better define any residual obstruction that may be present.  No indication for bronchodilators at this time

## 2020-08-25 NOTE — Assessment & Plan Note (Signed)
That was seen on CT abdomen.  She is intermediate risk due to her family history of lung cancer, 8 pack-year smoking.  Suspect a benign cause, particularly given her rheumatoid disease, not on immunologic medication.  We will perform a dedicated CT chest to characterize any other nodules that may be present, determine appropriate follow-up timing.  If the 4 mm nodule is the only abnormality then we would plan to repeat in 1 year.

## 2020-08-25 NOTE — Patient Instructions (Addendum)
We will perform a CT scan of the chest without contrast to follow pulmonary nodules We will perform full pulmonary function testing at your next office visit Follow with Dr. Lamonte Sakai next available with full pulmonary function testing on the same day.

## 2020-09-15 ENCOUNTER — Ambulatory Visit
Admission: RE | Admit: 2020-09-15 | Discharge: 2020-09-15 | Disposition: A | Discharge status: Home / Self Care | Payer: Medicare Other | Source: Ambulatory Visit | Attending: Emergency Medicine | Admitting: Emergency Medicine

## 2020-09-15 ENCOUNTER — Other Ambulatory Visit: Payer: Self-pay

## 2020-09-15 DIAGNOSIS — R911 Solitary pulmonary nodule: Secondary | ICD-10-CM

## 2020-09-21 ENCOUNTER — Telehealth: Payer: Self-pay | Admitting: Emergency Medicine

## 2020-09-21 DIAGNOSIS — R911 Solitary pulmonary nodule: Secondary | ICD-10-CM

## 2020-09-21 NOTE — Telephone Encounter (Signed)
Please let her know that I reviewed her Ct chest. The small nodules that we are going to follow are still present, are most consistent with inflammation (possibly related to her rheumatoid). There is a new inflammatory are that we will follow as well. Will plan to repeat her Ct chest without contrast in 6 months.   It's ok for her to put off OV until after we get the repeat CT this summer if she's most comfortable with that plan .

## 2020-09-21 NOTE — Telephone Encounter (Signed)
Called and spoke with patient, advised patient of results and recommendations per Dr. Lamonte Sakai.  She verbalized understanding.  CT scan ordered for 6 months for now and call back placed for 6 months after CT scan.  Advised patient was to have PFT's, patient stated that unless it was absolutely necessary, she would like to hold off on that right now d/t the increase in cases of Covid.  Advised I would make a note in her chart.  Nothing further needed.

## 2020-09-21 NOTE — Telephone Encounter (Signed)
Called and spoke to pt. Pt is requesting the results of the super D CT chest from 09/15/20. Pt was instructed at last visit to schedule PFT with return OV, however, pt states she wants to wait until Omicron has decreased in the community. Pt states she is terrified of contracting the virus and does not want to return unless it is urgent. Pt is aware to call back to reschedule visits when she is comfortable.   Dr. Lamonte Sakai, please advise on super D CT chest results. Thanks.

## 2020-09-22 ENCOUNTER — Telehealth: Payer: Self-pay | Admitting: Emergency Medicine

## 2020-09-22 NOTE — Telephone Encounter (Signed)
Sent per request ,

## 2020-10-30 ENCOUNTER — Other Ambulatory Visit (HOSPITAL_COMMUNITY): Payer: Medicare Other

## 2020-10-30 ENCOUNTER — Other Ambulatory Visit (HOSPITAL_COMMUNITY)
Admission: RE | Admit: 2020-10-30 | Discharge: 2020-10-30 | Disposition: A | Discharge status: Home / Self Care | Payer: Medicare Other | Source: Ambulatory Visit | Attending: Emergency Medicine | Admitting: Emergency Medicine

## 2020-10-30 DIAGNOSIS — Z20822 Contact with and (suspected) exposure to covid-19: Secondary | ICD-10-CM | POA: Insufficient documentation

## 2020-10-30 DIAGNOSIS — Z01812 Encounter for preprocedural laboratory examination: Secondary | ICD-10-CM | POA: Diagnosis present

## 2020-10-30 LAB — SARS CORONAVIRUS 2 (TAT 6-24 HRS): SARS Coronavirus 2: NEGATIVE

## 2020-11-03 ENCOUNTER — Ambulatory Visit: Payer: Medicare Other | Admitting: Emergency Medicine

## 2020-11-03 ENCOUNTER — Encounter: Payer: Self-pay | Admitting: Emergency Medicine

## 2020-11-03 ENCOUNTER — Ambulatory Visit (INDEPENDENT_AMBULATORY_CARE_PROVIDER_SITE_OTHER): Payer: Medicare Other | Admitting: Emergency Medicine

## 2020-11-03 ENCOUNTER — Other Ambulatory Visit: Payer: Self-pay

## 2020-11-03 DIAGNOSIS — R911 Solitary pulmonary nodule: Secondary | ICD-10-CM

## 2020-11-03 DIAGNOSIS — J4520 Mild intermittent asthma, uncomplicated: Secondary | ICD-10-CM

## 2020-11-03 LAB — PULMONARY FUNCTION TEST
DL/VA % pred: 95 %
DL/VA: 4.01 ml/min/mmHg/L
DLCO cor % pred: 94 %
DLCO cor: 17.26 ml/min/mmHg
DLCO unc % pred: 94 %
DLCO unc: 17.26 ml/min/mmHg
FEF 25-75 Post: 2.92 L/sec
FEF 25-75 Pre: 2.86 L/sec
FEF2575-%Change-Post: 2 %
FEF2575-%Pred-Post: 155 %
FEF2575-%Pred-Pre: 152 %
FEV1-%Change-Post: -1 %
FEV1-%Pred-Post: 123 %
FEV1-%Pred-Pre: 125 %
FEV1-Post: 2.65 L
FEV1-Pre: 2.68 L
FEV1FVC-%Change-Post: 2 %
FEV1FVC-%Pred-Pre: 109 %
FEV6-%Change-Post: -3 %
FEV6-%Pred-Post: 114 %
FEV6-%Pred-Pre: 119 %
FEV6-Post: 3.09 L
FEV6-Pre: 3.22 L
FEV6FVC-%Change-Post: 0 %
FEV6FVC-%Pred-Post: 104 %
FEV6FVC-%Pred-Pre: 104 %
FVC-%Change-Post: -3 %
FVC-%Pred-Post: 109 %
FVC-%Pred-Pre: 114 %
FVC-Post: 3.09 L
FVC-Pre: 3.22 L
Post FEV1/FVC ratio: 86 %
Post FEV6/FVC ratio: 100 %
Pre FEV1/FVC ratio: 83 %
Pre FEV6/FVC Ratio: 100 %
RV % pred: 103 %
RV: 2.13 L
TLC % pred: 108 %
TLC: 5.15 L

## 2020-11-03 NOTE — Assessment & Plan Note (Signed)
CT chest reviewed, shows some punctate nodules, 1 of which is calcified.  The left medial basilar nodule in question was measured at just under 6 mm, possibly enlarged but difficult to say based on scan technique.  She needs a follow-up scan in 6 months to ensure stability.  Suspect that this is a benign etiology given her RA.  There is a small right diaphragmatic defect with some associated atelectasis that is new compared to December 2021.  We will follow this.

## 2020-11-03 NOTE — Patient Instructions (Signed)
Your pulmonary function testing today is normal.  This is good news. You can keep your albuterol available to use 2 puffs if needed for shortness of breath We will repeat your CT scan of the chest in July to compare with your recent scan. Follow with Dr. Lamonte Sakai in July after your scan so that we can discuss

## 2020-11-03 NOTE — Progress Notes (Signed)
   Subjective:    Patient ID: April Waters, female    DOB: 1951-07-04, 70 y.o.   MRN: [id number]  HPI 70 year old former smoker (8 pack years) with a history of rheumatoid arthritis, migraines, allergic rhinitis, childhood asthma. She has had increased allergy symptoms for the last 9 years since moving to - she had an allergy eval and was dx with burning mouth syndrome. Her abd sx improved significantly after modifying her diet based on the allergy testing. She also has some occasional wheezing since she moved to [facility].   She is referred today for a pulmonary nodule that was seen spuriously on CT of the abdomen done on 08/10/2020  CT scan of the abdomen and pelvis 08/10/2020 reviewed by me, shows no acute process in the abdomen, a possible 4 mm left lung base pulmonary nodule. There is some hazy medial R basilar infiltrate  ROV 11/03/20 --70 year old woman with minimal tobacco history, RA, allergic rhinitis, childhood asthma, allergies.  She had a new pulmonary nodule identified on CT scan of the abdomen 08/10/2020.  CT scan of the chest done 09/15/2020 reviewed by me, shows an area of atelectasis in the posterior right lower lobe adjacent to small diaphragmatic hernia/defect, 1.2 cm.  Left lower lobe pulmonary nodule measuring 6 mm (estimated at 4 mm 08/10/2020).  There are small 2-3 mm nodules noted on the right lung with calcium consistent with a granuloma.  MDM: Reviewed PFT, CT chest as above    Review of Systems As per HPI     Objective:   Physical Exam  Vitals:   11/03/20 1606  BP: 108/64  Pulse: 99  Temp: 98.4 F (36.9 C)  TempSrc: Temporal  SpO2: 99%  Weight: 84 lb (38.1 kg)  Height: 5\' 2"  (1.575 m)   Gen: Pleasant, very thin woman, in no distress,  normal affect  ENT: No lesions,  mouth clear,  oropharynx clear, no postnasal drip  Neck: No JVD, no stridor  Lungs: No use of accessory muscles, no crackles or wheezing on normal respiration, no wheeze on forced  expiration  Cardiovascular: RRR, heart sounds normal, no murmur or gallops, no peripheral edema  Musculoskeletal: joint changes from RA on both hands.   Neuro: alert, awake, non focal  Skin: Warm, no lesions or rash      Assessment & Plan:  Pulmonary nodule CT chest reviewed, shows some punctate nodules, 1 of which is calcified.  The left medial basilar nodule in question was measured at just under 6 mm, possibly enlarged but difficult to say based on scan technique.  She needs a follow-up scan in 6 months to ensure stability.  Suspect that this is a benign etiology given her RA.  There is a small right diaphragmatic defect with some associated atelectasis that is new compared to December 2021.  We will follow this.  Mild intermittent asthma History of asthma.  Her pulmonary function testing today is normal.  Reassured her about this.  She can keep albuterol available to use if needed.  Baltazar Apo, MD, PhD 11/03/2020, 4:47 PM [facility] [phone] or if no answer [phone]

## 2020-11-03 NOTE — Assessment & Plan Note (Signed)
History of asthma.  Her pulmonary function testing today is normal.  Reassured her about this.  She can keep albuterol available to use if needed.

## 2020-11-03 NOTE — Progress Notes (Signed)
PFT done today. 

## 2020-11-08 ENCOUNTER — Telehealth: Payer: Self-pay | Admitting: Allergy and Immunology

## 2020-11-08 NOTE — Telephone Encounter (Signed)
Pt called wanting to know if it is safe to eat peanut butter. Pt stated that in her skin test no allergy was shown to peanuts but since she is allergic to pistachios, she would like reassurance to be able to eat peanuts.   Please advise.

## 2020-11-10 NOTE — Telephone Encounter (Signed)
Message disregarded. Thank you.

## 2020-11-10 NOTE — Telephone Encounter (Signed)
Patient called back and said she realized she is not allergic to peanuts and she would like you to disregard this message.

## 2021-02-07 ENCOUNTER — Other Ambulatory Visit: Payer: Self-pay

## 2021-02-07 ENCOUNTER — Ambulatory Visit (INDEPENDENT_AMBULATORY_CARE_PROVIDER_SITE_OTHER): Payer: Medicare Other | Admitting: Podiatry

## 2021-02-07 ENCOUNTER — Encounter: Payer: Self-pay | Admitting: Podiatry

## 2021-02-07 DIAGNOSIS — L84 Corns and callosities: Secondary | ICD-10-CM

## 2021-02-07 DIAGNOSIS — M069 Rheumatoid arthritis, unspecified: Secondary | ICD-10-CM

## 2021-02-07 DIAGNOSIS — I7300 Raynaud's syndrome without gangrene: Secondary | ICD-10-CM

## 2021-02-07 DIAGNOSIS — L600 Ingrowing nail: Secondary | ICD-10-CM

## 2021-02-07 DIAGNOSIS — R52 Pain, unspecified: Secondary | ICD-10-CM

## 2021-02-08 NOTE — Progress Notes (Signed)
  Subjective:  Patient ID: April Waters, female    DOB: October 18, 1950,  MRN: [id number]  Chief Complaint  Patient presents with  . Ingrown Toenail     (NP)ingrown toenail-left foot, big toe    70 y.o. female presents with the above complaint. History confirmed with patient.  The ingrown on the left medial side is not currently hurting but can become bothersome.  She is a history of RA and Raynaud's.  She also has calluses under the left fifth metatarsal as well as the right second toe.  She has intermittent numbness in the second and third toe on the right foot as well  Objective:  Physical Exam: no trophic changes or ulcerative lesions, normal DP and PT pulses and normal sensory exam.  Toes are cool to touch.  Evidence of previous ingrown nail in the left medial hallux nail, not currently painful.  Porokeratosis submetatarsal 5 on the left and submetatarsal 2 on the right Assessment:   1. Callus of foot   2. Pain   3. Ingrowing left great toenail   4. Rheumatoid arthritis, involving unspecified site, unspecified whether rheumatoid factor present ([place])   5. Raynaud's disease without gangrene      Plan:  Patient was evaluated and treated and all questions answered.  All symptomatic hyperkeratoses were safely debrided with a sterile #15 blade to patient's level of comfort without incident. We discussed preventative and palliative care of these lesions including supportive and accommodative shoegear, padding, prefabricated and custom molded accommodative orthoses, use of a pumice stone and lotions/creams daily.  I discussed treatment of ingrown nails with her including partial permanent nail avulsion.  It is currently not that bothersome.  I debrided the corner that is ingrowing to alleviate further pain and pressure.  She will return as needed if this requires partial permanent nail avulsion  Return if symptoms worsen or fail to improve.

## 2021-02-16 ENCOUNTER — Other Ambulatory Visit: Payer: Self-pay | Admitting: Family Medicine

## 2021-02-16 DIAGNOSIS — Z1231 Encounter for screening mammogram for malignant neoplasm of breast: Secondary | ICD-10-CM

## 2021-02-22 ENCOUNTER — Ambulatory Visit (INDEPENDENT_AMBULATORY_CARE_PROVIDER_SITE_OTHER): Payer: Medicare Other | Admitting: Allergy and Immunology

## 2021-02-22 ENCOUNTER — Other Ambulatory Visit: Payer: Self-pay

## 2021-02-22 ENCOUNTER — Telehealth: Payer: Self-pay

## 2021-02-22 VITALS — BP 112/60 | HR 79 | Temp 98.2°F | Resp 18 | Ht 62.0 in | Wt 82.2 lb

## 2021-02-22 DIAGNOSIS — J3089 Other allergic rhinitis: Secondary | ICD-10-CM | POA: Diagnosis not present

## 2021-02-22 DIAGNOSIS — T7800XA Anaphylactic reaction due to unspecified food, initial encounter: Secondary | ICD-10-CM

## 2021-02-22 DIAGNOSIS — J301 Allergic rhinitis due to pollen: Secondary | ICD-10-CM | POA: Diagnosis not present

## 2021-02-22 DIAGNOSIS — J4520 Mild intermittent asthma, uncomplicated: Secondary | ICD-10-CM

## 2021-02-22 DIAGNOSIS — T781XXD Other adverse food reactions, not elsewhere classified, subsequent encounter: Secondary | ICD-10-CM

## 2021-02-22 NOTE — Patient Instructions (Signed)
  1.  Start a course of immunotherapy (after insurance check)  2.  If needed:   A. Xopenx HFA - 2 inhalations every 4-6 hours  B. OTC antihistamine - claritin / zyrtec 10 mg - 1 tablet 1 time per day  C. Epi-pen, benadryl, MD/ER evalution for allergic reaction.  3. Return to clinic in 6 months or earlier if problem.

## 2021-02-22 NOTE — Telephone Encounter (Signed)
Patient called back stating she was on the phone for an hour and a half with Medicare trying to give them the diagnosis code (Perennial allergic rhinitis - J30.89). Patient was told that our office needed to call the medicare provider number [phone]) and provide the diagnosis code in order for them to cover the allergy injections.  Patient would like a call back to confirm diagnosis code was sent in, [phone]. (Patient states Dr. Neldon Mc can take his time as she is aware he and our office is very busy)

## 2021-02-22 NOTE — Telephone Encounter (Signed)
Patient called the office because in order for her insurance to cover the allergy shots they needed a diagnosis code. Perennial allergic rhinitis (J30.89) was provided based on her 02/22/21 OV.

## 2021-02-22 NOTE — Progress Notes (Signed)
[facility]   Follow-up Note  Referring Provider: Maury Dus, MD Primary Provider: Maury Dus, MD Date of Office Visit: 02/22/2021  Subjective:   April Waters (DOB: Oct 20, 1950) is a 70 y.o. female who returns to the [facility] on 02/22/2021 in re-evaluation of the following:  HPI: Marjean returns to this clinic in evaluation of allergic rhinoconjunctivitis, history of mild intermittent asthma, oral allergy syndrome, and generalized abdominal pain.  I last saw her in this clinic during her initial evaluation of 01 June 2020.  She continues to have problems with her nasal airway and her eyes especially during the spring and she has tried antihistamines and she has tried nasal steroids and nothing is really worked and she is interested in starting a course of immunotherapy.  She has had minimal issues with her lower airway and rarely if ever uses a short acting bronchodilator.  She is very careful at this point in time about eating fresh vegetables and fruits and has actually done much better regarding her GI issue with this restriction.  She has had 4 COVID vaccinations.  In evaluation of her abdominal issue she had an abdominal CT scan which apparently identified pulmonary nodules and she is now being followed by Dr. Lamonte Sakai, pulmonary, with serial CT scans of her chest.  Allergies as of 02/22/2021       Reactions   Cardizem [diltiazem Hcl]    Codeine    Penicillins    Vicodin [hydrocodone-acetaminophen] Other (See Comments)   Muscle Weakness   Yeast Other (See Comments)        Medication List    acetaminophen 500 MG tablet Commonly known as: TYLENOL Take 500 mg by mouth every 6 (six) hours as needed. Reported on 11/29/2015   ascorbic acid 500 MG tablet Commonly known as: VITAMIN C Take 500 mg by mouth daily.   Calcium 600/Vitamin D 600-400 MG-UNIT Chew Generic drug: Calcium Carb-Cholecalciferol 1  tablet with meals   CALCIUM-VITAMIN D PO Take 300 mg by mouth daily.   EPINEPHrine 0.3 mg/0.3 mL Soaj injection Commonly known as: EPI-PEN Inject 3 mLs into the skin as needed.   levalbuterol 45 MCG/ACT inhaler Commonly known as: Xopenex HFA Inhale 2 puffs into the lungs every 6 (six) hours as needed for wheezing (cough).   Magnesium 300 MG Caps Take 1 capsule by mouth daily.   peppermint oil liquid   sodium fluoride 1.1 % Gel dental gel Commonly known as: FLUORISHIELD PreviDent 5000 Booster Plus 1.1 % dental paste  BRUSH PEA SIZED AMOUNT ON TEETH, SPIT OUT EXCESS. DO NOT EAT, DRINK, OR RINSE FOR 30 MINUTES.   Vitamin D3 10 MCG (400 UNIT) Caps 1 capsule   VITAMIN K PO Take 30 mcg by mouth daily.        Past Medical History:  Diagnosis Date   Allergy    Asthma    C. difficile enteritis    Chicken pox    Common migraine with intractable migraine 05/26/2015   Randell Patient infection 1986   lasted about a year late diagnosis   Measles    Osteoporosis    Rheumatoid arthritis ([place])     Past Surgical History:  Procedure Laterality Date   ANTERIOR CRUCIATE LIGAMENT REPAIR  1998   right   cebaceous cyst     right breast - age 89   fluid knee  1994   left   HERNIA REPAIR     bilateral  inguinal 2 months   KIDNEY STONE SURGERY     1980   KNEE SURGERY  1990   medial meniscous right   LAPAROSCOPY  1993   endometriosis   nasal polyps     age 54    Review of systems negative except as noted in HPI / PMHx or noted below:  Review of Systems  Constitutional: Negative.   HENT: Negative.    Eyes: Negative.   Respiratory: Negative.    Cardiovascular: Negative.   Gastrointestinal: Negative.   Genitourinary: Negative.   Musculoskeletal: Negative.   Skin: Negative.   Neurological: Negative.   Endo/Heme/Allergies: Negative.   Psychiatric/Behavioral: Negative.      Objective:   Vitals:   02/22/21 1101  BP: 112/60  Pulse: 79  Resp: 18  Temp: 98.2 F (36.8  C)  SpO2: 99%   Height: 5\' 2"  (157.5 cm)  Weight: 82 lb 3.2 oz (37.3 kg)   Physical Exam Constitutional:      Appearance: She is not diaphoretic.  HENT:     Head: Normocephalic.     Right Ear: Tympanic membrane, ear canal and external ear normal.     Left Ear: Tympanic membrane, ear canal and external ear normal.     Nose: Nose normal. No mucosal edema or rhinorrhea.     Mouth/Throat:     Pharynx: Uvula midline. No oropharyngeal exudate.  Eyes:     Conjunctiva/sclera: Conjunctivae normal.  Neck:     Thyroid: No thyromegaly.     Trachea: Trachea normal. No tracheal tenderness or tracheal deviation.  Cardiovascular:     Rate and Rhythm: Normal rate and regular rhythm.     Heart sounds: Normal heart sounds, S1 normal and S2 normal. No murmur heard. Pulmonary:     Effort: No respiratory distress.     Breath sounds: Normal breath sounds. No stridor. No wheezing or rales.  Lymphadenopathy:     Head:     Right side of head: No tonsillar adenopathy.     Left side of head: No tonsillar adenopathy.     Cervical: No cervical adenopathy.  Skin:    Findings: No erythema or rash.     Nails: There is no clubbing.  Neurological:     Mental Status: She is alert.     Diagnostics:    Spirometry was performed and demonstrated an FEV1 of 2.44 at 117 % of predicted.   Assessment and Plan:   1. Perennial allergic rhinitis   2. Seasonal allergic rhinitis due to pollen   3. Asthma, mild intermittent, well-controlled   4. Pollen-food allergy, subsequent encounter   5. Allergy with anaphylaxis due to food     1.  Start a course of immunotherapy (after insurance check)  2.  If needed:   A. Xopenx HFA - 2 inhalations every 4-6 hours  B. OTC antihistamine - claritin / zyrtec 10 mg - 1 tablet 1 time per day  C. Epi-pen, benadryl, MD/ER evalution for allergic reaction.  3. Return to clinic in 6 months or earlier if problem.   [name] appears to be doing relatively well but still remains  symptomatic regarding her atopic disease and she is interested in starting a course of immunotherapy and after she checks with her insurance company and she gets approval of financial coverage for that form of treatment she can start a course of immunotherapy.  She has several antihistamines and she has a rescue inhaler that she can use if needed and of course an EpiPen  should she ever develop a severe allergic reaction with specific food consumption.  I will see her back in his clinic in 6 months or earlier if there is a problem.   Allena Katz, MD [facility]

## 2021-02-23 ENCOUNTER — Encounter: Payer: Self-pay | Admitting: Allergy and Immunology

## 2021-02-23 NOTE — Telephone Encounter (Signed)
I called and spoke with Medicare and the representative on the phone told me that she is unable to confirm whether allergy injections are covered until they are billed for the service. [name] do you have any insight on how much Medicare may cover allergy injections/ vials?

## 2021-02-25 NOTE — Telephone Encounter (Signed)
Called and spoke with Medicare and they stated that there was not a Prior Authorization needed. Called and spoke with the patient and advised. Patient has been scheduled to start allergy injections.

## 2021-03-03 ENCOUNTER — Other Ambulatory Visit: Payer: Self-pay | Admitting: Allergy and Immunology

## 2021-03-03 DIAGNOSIS — J301 Allergic rhinitis due to pollen: Secondary | ICD-10-CM

## 2021-03-03 NOTE — Progress Notes (Signed)
Aeroallergen Immunotherapy   Ordering Provider: Dr. Allena Katz   Patient Details  Name: April Waters  MRN: [id number]  Date of Birth: 24-Oct-1950   Order one of two   Vial Label: weed   0.3 ml (Volume)  1:20 Concentration -- Ragweed Mix  0.2 ml (Volume)  1:20 Concentration -- Skeet Simmer elder, Rough*    0.5  ml Extract Subtotal  4.5  ml Diluent  5.0  ml Maintenance Total   Schedule:  B   Blue Vial (1:100,000): Schedule B (6 doses)  Yellow Vial (1:10,000): Schedule B (6 doses)  Green Vial (1:1,000): Schedule B (6 doses)  Red Vial (1:100): Schedule A (12 doses)   Special Instructions: 1-2 times per week

## 2021-03-03 NOTE — Progress Notes (Signed)
Aeroallergen Immunotherapy   Ordering Provider: Dr. Allena Katz   Patient Details  Name: April Waters  MRN: [id number]  Date of Birth: July 27, 1951   Order one of two   Vial Label: grass / tree   0.3 ml (Volume)  BAU Concentration -- 7 Grass Mix* 100,000 ([id number] [place], [place], [place], [place], [place], [place], [place])  0.1 ml (Volume)  BAU Concentration -- [place] 10,000  0.2 ml (Volume)  1:20 Concentration -- Steva Colder American*  0.2 ml (Volume)  1:10 Concentration -- Cedar, red  0.2 ml (Volume)  1:20 Concentration -- [place], Eastern*  0.2 ml (Volume)  1:20 Concentration -- Maple Mix*    1.2  ml Extract Subtotal  3.8  ml Diluent  5.0  ml Maintenance Total   Schedule:  B   Blue Vial (1:100,000): Schedule B (6 doses)  Yellow Vial (1:10,000): Schedule B (6 doses)  Green Vial (1:1,000): Schedule B (6 doses)  Red Vial (1:100): Schedule A (12 doses)   Special Instructions: 1-2 times per week

## 2021-03-03 NOTE — Progress Notes (Signed)
vIALS MADE. EXP 03-03-22

## 2021-03-08 DIAGNOSIS — J302 Other seasonal allergic rhinitis: Secondary | ICD-10-CM

## 2021-03-09 ENCOUNTER — Ambulatory Visit
Admission: RE | Admit: 2021-03-09 | Discharge: 2021-03-09 | Disposition: A | Discharge status: Home / Self Care | Payer: Medicare Other | Source: Ambulatory Visit | Attending: Emergency Medicine | Admitting: Emergency Medicine

## 2021-03-09 DIAGNOSIS — R911 Solitary pulmonary nodule: Secondary | ICD-10-CM

## 2021-03-15 ENCOUNTER — Other Ambulatory Visit: Payer: Self-pay

## 2021-03-15 ENCOUNTER — Ambulatory Visit (INDEPENDENT_AMBULATORY_CARE_PROVIDER_SITE_OTHER): Payer: Medicare Other | Admitting: *Deleted

## 2021-03-15 DIAGNOSIS — J309 Allergic rhinitis, unspecified: Secondary | ICD-10-CM | POA: Diagnosis not present

## 2021-03-15 NOTE — Progress Notes (Signed)
Immunotherapy   Patient Details  Name: April Waters MRN: [id number] Date of Birth: [birth year]  03/15/2021  [name] started injections for  [name]- Tree Following schedule: B  Frequency:1 time per week Epi-Pen:Epi-Pen Available  Consent signed and patient instructions given.  Patient started allergy injections today and received 0.54mL of Weed in the [place] and 0.76mL of Grass-Tree in the RUA. Patient waited 30 minutes and did not experience any issues.   [name] 03/15/2021, 12:16 PM

## 2021-03-22 ENCOUNTER — Ambulatory Visit (INDEPENDENT_AMBULATORY_CARE_PROVIDER_SITE_OTHER): Payer: Medicare Other

## 2021-03-22 DIAGNOSIS — J309 Allergic rhinitis, unspecified: Secondary | ICD-10-CM | POA: Diagnosis not present

## 2021-03-29 ENCOUNTER — Ambulatory Visit (INDEPENDENT_AMBULATORY_CARE_PROVIDER_SITE_OTHER): Payer: Medicare Other | Admitting: *Deleted

## 2021-03-29 DIAGNOSIS — J309 Allergic rhinitis, unspecified: Secondary | ICD-10-CM | POA: Diagnosis not present

## 2021-04-05 ENCOUNTER — Ambulatory Visit (INDEPENDENT_AMBULATORY_CARE_PROVIDER_SITE_OTHER): Payer: Medicare Other

## 2021-04-05 DIAGNOSIS — J309 Allergic rhinitis, unspecified: Secondary | ICD-10-CM | POA: Diagnosis not present

## 2021-04-08 ENCOUNTER — Ambulatory Visit (INDEPENDENT_AMBULATORY_CARE_PROVIDER_SITE_OTHER): Payer: Medicare Other | Admitting: Emergency Medicine

## 2021-04-08 ENCOUNTER — Encounter: Payer: Self-pay | Admitting: Emergency Medicine

## 2021-04-08 ENCOUNTER — Other Ambulatory Visit: Payer: Self-pay

## 2021-04-08 DIAGNOSIS — J4520 Mild intermittent asthma, uncomplicated: Secondary | ICD-10-CM | POA: Diagnosis not present

## 2021-04-08 DIAGNOSIS — R911 Solitary pulmonary nodule: Secondary | ICD-10-CM

## 2021-04-08 NOTE — Progress Notes (Signed)
Subjective:    Patient ID: April Waters, female    DOB: 28-Nov-1950, 70 y.o.   MRN: [id number]  HPI 70 year old former smoker (8 pack years) with a history of rheumatoid arthritis, migraines, allergic rhinitis, childhood asthma. She has had increased allergy symptoms for the last 9 years since moving to - she had an allergy eval and was dx with burning mouth syndrome. Her abd sx improved significantly after modifying her diet based on the allergy testing. She also has some occasional wheezing since she moved to [facility].   She is referred today for a pulmonary nodule that was seen spuriously on CT of the abdomen done on 08/10/2020  CT scan of the abdomen and pelvis 08/10/2020 reviewed by me, shows no acute process in the abdomen, a possible 4 mm left lung base pulmonary nodule. There is some hazy medial R basilar infiltrate  ROV 11/03/20 --70 year old woman with minimal tobacco history, RA, allergic rhinitis, childhood asthma, allergies.  She had a new pulmonary nodule identified on CT scan of the abdomen 08/10/2020.  CT scan of the chest done 09/15/2020 reviewed by me, shows an area of atelectasis in the posterior right lower lobe adjacent to small diaphragmatic hernia/defect, 1.2 cm.  Left lower lobe pulmonary nodule measuring 6 mm (estimated at 4 mm 08/10/2020).  There are small 2-3 mm nodules noted on the right lung with calcium consistent with a granuloma.  ROV 04/08/21 --follow-up visit for 70 year old woman with minimal tobacco history, rheumatoid arthritis, migraines, allergic rhinitis and possible mild intermittent asthma (reassuring pulmonary function testing 11/03/2020).  She is now on immunotherapy.  We have been following an abnormal CT scan of the chest with some punctate nodules, one calcified.  There is a left medial basilar nodule 6 mm, small right diaphragmatic defect with some associated atelectatic change that we have planned to follow. Sometimes has some dry cough. Unable to say yet whether the  immunotherapy is helping her allergies. Uses albuterol rarely.   CT scan of the chest 03/09/2021 reviewed by me, shows no change in the left lower lobe pulmonary nodule, measured at 5 mm (more difficult to see on this scan).  Also no change in the right sided Bochdalek hernia with some fat    Review of Systems As per HPI     Objective:   Physical Exam  Vitals:   04/08/21 1333  BP: (!) 100/58  Pulse: 73  Temp: 97.8 F (36.6 C)  TempSrc: Axillary  SpO2: 100%  Weight: 79 lb 12.8 oz (36.2 kg)  Height: '5\' 2"'$  (1.575 m)    Gen: Pleasant, very thin woman, in no distress,  normal affect  ENT: No lesions,  mouth clear,  oropharynx clear, no postnasal drip  Neck: No JVD, no stridor  Lungs: No use of accessory muscles, no crackles or wheezing on normal respiration, no wheeze on forced expiration  Cardiovascular: RRR, heart sounds normal, no murmur or gallops, no peripheral edema  Musculoskeletal: joint changes from RA on both hands.   Neuro: alert, awake, non focal  Skin: Warm, no lesions or rash      Assessment & Plan:  Mild intermittent asthma With reassuring spirometry.  Plan to keep albuterol available to use if needed.  Pulmonary nodule The area of right diaphragmatic eventration is stable.  The left lower lobe medial basilar nodule is more difficult to see on this study but does not appear to be changed in size.  Plan for repeat CT chest in 6 months to ensure stability.  Suspect a benign cause given scattered nodules with some calcium, her history of rheumatoid arthritis.  Please keep your albuterol available use 2 puffs if needed for shortness of breath, chest tightness, wheezing. Your CT scan of the chest appears stable.  We will plan to repeat in Dec 2022 Follow Dr. Lamonte Sakai in December after CT scan so that we can review the results together.  Baltazar Apo, MD, PhD 04/08/2021, 1:44 PM [facility] [phone] or if no answer [phone]

## 2021-04-08 NOTE — Assessment & Plan Note (Addendum)
The area of right diaphragmatic eventration is stable.  The left lower lobe medial basilar nodule is more difficult to see on this study but does not appear to be changed in size.  Plan for repeat CT chest in 6 months to ensure stability.  Suspect a benign cause given scattered nodules with some calcium, her history of rheumatoid arthritis.  Please keep your albuterol available use 2 puffs if needed for shortness of breath, chest tightness, wheezing. Your CT scan of the chest appears stable.  We will plan to repeat in Dec 2022 Follow Dr. Lamonte Sakai in December after CT scan so that we can review the results together.

## 2021-04-08 NOTE — Addendum Note (Signed)
Addended by: Lorretta Harp on: 04/08/2021 01:47 PM   Modules accepted: Orders

## 2021-04-08 NOTE — Assessment & Plan Note (Signed)
With reassuring spirometry.  Plan to keep albuterol available to use if needed.

## 2021-04-08 NOTE — Patient Instructions (Addendum)
Please keep your albuterol available use 2 puffs if needed for shortness of breath, chest tightness, wheezing. Your CT scan of the chest appears stable.  We will plan to repeat in Dec 2022 Follow Dr. Lamonte Waters in December after CT scan so that we can review the results together.

## 2021-04-11 ENCOUNTER — Ambulatory Visit (INDEPENDENT_AMBULATORY_CARE_PROVIDER_SITE_OTHER): Payer: Medicare Other | Admitting: *Deleted

## 2021-04-11 DIAGNOSIS — J309 Allergic rhinitis, unspecified: Secondary | ICD-10-CM

## 2021-04-12 ENCOUNTER — Ambulatory Visit
Admission: RE | Admit: 2021-04-12 | Discharge: 2021-04-12 | Disposition: A | Discharge status: Home / Self Care | Payer: Medicare Other | Source: Ambulatory Visit | Attending: Family Medicine | Admitting: Family Medicine

## 2021-04-12 ENCOUNTER — Other Ambulatory Visit: Payer: Self-pay

## 2021-04-12 DIAGNOSIS — Z1231 Encounter for screening mammogram for malignant neoplasm of breast: Secondary | ICD-10-CM

## 2021-04-13 ENCOUNTER — Other Ambulatory Visit: Payer: Self-pay | Admitting: Allergy and Immunology

## 2021-04-18 ENCOUNTER — Other Ambulatory Visit: Payer: Self-pay | Admitting: Family Medicine

## 2021-04-18 DIAGNOSIS — R928 Other abnormal and inconclusive findings on diagnostic imaging of breast: Secondary | ICD-10-CM

## 2021-04-19 ENCOUNTER — Ambulatory Visit (INDEPENDENT_AMBULATORY_CARE_PROVIDER_SITE_OTHER): Payer: Medicare Other | Admitting: *Deleted

## 2021-04-19 DIAGNOSIS — J309 Allergic rhinitis, unspecified: Secondary | ICD-10-CM

## 2021-04-25 ENCOUNTER — Ambulatory Visit: Payer: Medicare Other

## 2021-04-25 ENCOUNTER — Ambulatory Visit
Admission: RE | Admit: 2021-04-25 | Discharge: 2021-04-25 | Disposition: A | Discharge status: Home / Self Care | Payer: Medicare Other | Source: Ambulatory Visit | Attending: Family Medicine | Admitting: Family Medicine

## 2021-04-25 ENCOUNTER — Ambulatory Visit (INDEPENDENT_AMBULATORY_CARE_PROVIDER_SITE_OTHER): Payer: Medicare Other | Admitting: *Deleted

## 2021-04-25 ENCOUNTER — Other Ambulatory Visit: Payer: Self-pay

## 2021-04-25 DIAGNOSIS — R928 Other abnormal and inconclusive findings on diagnostic imaging of breast: Secondary | ICD-10-CM

## 2021-04-25 DIAGNOSIS — J309 Allergic rhinitis, unspecified: Secondary | ICD-10-CM | POA: Diagnosis not present

## 2021-04-26 ENCOUNTER — Ambulatory Visit: Payer: Medicare Other | Admitting: Podiatry

## 2021-04-28 ENCOUNTER — Telehealth: Payer: Self-pay | Admitting: Emergency Medicine

## 2021-04-29 NOTE — Telephone Encounter (Signed)
PT has been made aware that her CT appt for DEC will be made for her in NOV.  Pt verbalized understanding & states nothing further needed at this time.

## 2021-05-02 ENCOUNTER — Ambulatory Visit: Payer: Medicare Other | Admitting: Podiatry

## 2021-05-02 ENCOUNTER — Other Ambulatory Visit: Payer: Medicare Other

## 2021-05-02 ENCOUNTER — Ambulatory Visit (INDEPENDENT_AMBULATORY_CARE_PROVIDER_SITE_OTHER): Payer: Medicare Other

## 2021-05-02 DIAGNOSIS — J309 Allergic rhinitis, unspecified: Secondary | ICD-10-CM

## 2021-05-03 ENCOUNTER — Ambulatory Visit (INDEPENDENT_AMBULATORY_CARE_PROVIDER_SITE_OTHER): Payer: Medicare Other | Admitting: Podiatry

## 2021-05-03 ENCOUNTER — Other Ambulatory Visit: Payer: Self-pay

## 2021-05-03 DIAGNOSIS — I7300 Raynaud's syndrome without gangrene: Secondary | ICD-10-CM | POA: Diagnosis not present

## 2021-05-03 DIAGNOSIS — L84 Corns and callosities: Secondary | ICD-10-CM | POA: Diagnosis not present

## 2021-05-03 DIAGNOSIS — L600 Ingrowing nail: Secondary | ICD-10-CM | POA: Diagnosis not present

## 2021-05-03 DIAGNOSIS — R52 Pain, unspecified: Secondary | ICD-10-CM

## 2021-05-03 DIAGNOSIS — M069 Rheumatoid arthritis, unspecified: Secondary | ICD-10-CM | POA: Diagnosis not present

## 2021-05-03 NOTE — Patient Instructions (Signed)

## 2021-05-04 NOTE — Progress Notes (Signed)
  Subjective:  Patient ID: April Waters, female    DOB: 1950-12-02,  MRN: [id number]  Chief Complaint  Patient presents with   Callouses    Painful callus lesion right foot    70 y.o. female presents with the above complaint. History confirmed with patient.  The ingrown on the right medial side is hurting again.  She is a history of RA and Raynaud's.  She also has calluses under the left fifth metatarsal as well as the right second toe.  She has intermittent numbness in the second and third toe on the right foot as well  Objective:  Physical Exam: no trophic changes or ulcerative lesions, normal DP and PT pulses and normal sensory exam.  Toes are cool to touch.  Right hallux nail medial border ingrown.  Porokeratosis submetatarsal 5 on the left and submetatarsal 2 on the right Assessment:   1. Callus of foot   2. Pain   3. Ingrowing right great toenail   4. Rheumatoid arthritis, involving unspecified site, unspecified whether rheumatoid factor present ([place])   5. Raynaud's disease without gangrene      Plan:  Patient was evaluated and treated and all questions answered.  All symptomatic hyperkeratoses were safely debrided with a sterile #15 blade to patient's level of comfort without incident. We discussed preventative and palliative care of these lesions including supportive and accommodative shoegear, padding, prefabricated and custom molded accommodative orthoses, use of a pumice stone and lotions/creams daily.  I discussed treatment of ingrown nails with her including partial permanent nail avulsion.  It is currently not that bothersome.  I debrided the corner that is ingrowing to alleviate further pain and pressure.  She will return as needed if this requires partial permanent nail avulsion  Return if symptoms worsen or fail to improve.

## 2021-05-10 ENCOUNTER — Ambulatory Visit (INDEPENDENT_AMBULATORY_CARE_PROVIDER_SITE_OTHER): Payer: Medicare Other

## 2021-05-10 DIAGNOSIS — J309 Allergic rhinitis, unspecified: Secondary | ICD-10-CM | POA: Diagnosis not present

## 2021-05-16 ENCOUNTER — Ambulatory Visit (INDEPENDENT_AMBULATORY_CARE_PROVIDER_SITE_OTHER): Payer: Medicare Other | Admitting: *Deleted

## 2021-05-16 ENCOUNTER — Telehealth: Payer: Self-pay | Admitting: Emergency Medicine

## 2021-05-16 DIAGNOSIS — J309 Allergic rhinitis, unspecified: Secondary | ICD-10-CM

## 2021-05-16 NOTE — Telephone Encounter (Signed)
Called and spoke with patient. She stated that she was trying to get her 3rd covid vaccine but the health department does not have them nor are they sure when they will receive them. I advised her that since the new booster would cover the [place] virus, they have pulled all of the old vaccines and stopped giving boosters since they are waiting on the new booster. I made her aware that as soon as they have the boosters, they would let the public know.   She verbalized understanding.   Nothing further needed at time of call.

## 2021-05-23 NOTE — Telephone Encounter (Signed)
April Waters to April Waters M     4:03 PM Pt got Booster today is no longer in need of the info

## 2021-05-25 ENCOUNTER — Ambulatory Visit (INDEPENDENT_AMBULATORY_CARE_PROVIDER_SITE_OTHER): Payer: Medicare Other

## 2021-05-25 DIAGNOSIS — J309 Allergic rhinitis, unspecified: Secondary | ICD-10-CM | POA: Diagnosis not present

## 2021-05-30 ENCOUNTER — Ambulatory Visit (INDEPENDENT_AMBULATORY_CARE_PROVIDER_SITE_OTHER): Payer: Medicare Other

## 2021-05-30 DIAGNOSIS — J309 Allergic rhinitis, unspecified: Secondary | ICD-10-CM | POA: Diagnosis not present

## 2021-06-07 ENCOUNTER — Ambulatory Visit (INDEPENDENT_AMBULATORY_CARE_PROVIDER_SITE_OTHER): Payer: Medicare Other | Admitting: *Deleted

## 2021-06-07 DIAGNOSIS — J309 Allergic rhinitis, unspecified: Secondary | ICD-10-CM | POA: Diagnosis not present

## 2021-06-14 ENCOUNTER — Ambulatory Visit (INDEPENDENT_AMBULATORY_CARE_PROVIDER_SITE_OTHER): Payer: Medicare Other

## 2021-06-14 DIAGNOSIS — J309 Allergic rhinitis, unspecified: Secondary | ICD-10-CM | POA: Diagnosis not present

## 2021-06-20 ENCOUNTER — Ambulatory Visit (INDEPENDENT_AMBULATORY_CARE_PROVIDER_SITE_OTHER): Payer: Medicare Other | Admitting: *Deleted

## 2021-06-20 DIAGNOSIS — J309 Allergic rhinitis, unspecified: Secondary | ICD-10-CM

## 2021-06-28 ENCOUNTER — Ambulatory Visit (INDEPENDENT_AMBULATORY_CARE_PROVIDER_SITE_OTHER): Payer: Medicare Other | Admitting: *Deleted

## 2021-06-28 DIAGNOSIS — J309 Allergic rhinitis, unspecified: Secondary | ICD-10-CM

## 2021-07-04 ENCOUNTER — Ambulatory Visit (INDEPENDENT_AMBULATORY_CARE_PROVIDER_SITE_OTHER): Payer: Medicare Other

## 2021-07-04 DIAGNOSIS — J309 Allergic rhinitis, unspecified: Secondary | ICD-10-CM | POA: Diagnosis not present

## 2021-07-12 ENCOUNTER — Ambulatory Visit (INDEPENDENT_AMBULATORY_CARE_PROVIDER_SITE_OTHER): Payer: Medicare Other | Admitting: *Deleted

## 2021-07-12 ENCOUNTER — Other Ambulatory Visit: Payer: Self-pay

## 2021-07-12 ENCOUNTER — Telehealth: Payer: Self-pay | Admitting: Emergency Medicine

## 2021-07-12 ENCOUNTER — Encounter: Payer: Self-pay | Admitting: Dermatology

## 2021-07-12 ENCOUNTER — Ambulatory Visit (INDEPENDENT_AMBULATORY_CARE_PROVIDER_SITE_OTHER): Payer: Medicare Other | Admitting: Dermatology

## 2021-07-12 DIAGNOSIS — M793 Panniculitis, unspecified: Secondary | ICD-10-CM | POA: Diagnosis not present

## 2021-07-12 DIAGNOSIS — Z1283 Encounter for screening for malignant neoplasm of skin: Secondary | ICD-10-CM

## 2021-07-12 DIAGNOSIS — J309 Allergic rhinitis, unspecified: Secondary | ICD-10-CM | POA: Diagnosis not present

## 2021-07-13 NOTE — Telephone Encounter (Signed)
I have pt's CT order and scheduled CT - 12/1 at 1:20 at GI.  Called pt & gave her appt info.  Nothing further needed.

## 2021-07-19 ENCOUNTER — Ambulatory Visit (INDEPENDENT_AMBULATORY_CARE_PROVIDER_SITE_OTHER): Payer: Medicare Other | Admitting: *Deleted

## 2021-07-19 DIAGNOSIS — J309 Allergic rhinitis, unspecified: Secondary | ICD-10-CM

## 2021-07-26 ENCOUNTER — Ambulatory Visit (INDEPENDENT_AMBULATORY_CARE_PROVIDER_SITE_OTHER): Payer: Medicare Other | Admitting: *Deleted

## 2021-07-26 DIAGNOSIS — J309 Allergic rhinitis, unspecified: Secondary | ICD-10-CM

## 2021-07-31 ENCOUNTER — Encounter: Payer: Self-pay | Admitting: Dermatology

## 2021-07-31 NOTE — Progress Notes (Signed)
   New Patient   Subjective  Emmalie Haigh is a 70 y.o. female who presents for the following: Skin Problem (Bumps x 4 months- right arm & left shoulder- "painful to touch" tx- non3).  Under the skin spots which were historically painful Location:  Duration:  Quality:  Associated Signs/Symptoms: Modifying Factors:  Severity:  Timing: Context:    The following portions of the chart were reviewed this encounter and updated as appropriate:  Tobacco  Allergies  Meds  Problems  Med Hx  Surg Hx  Fam Hx      Objective  Well appearing patient in no apparent distress; mood and affect are within normal limits. No atypical pigmented lesions or signs of nonmelanoma skin cancer.  Left Shoulder - Anterior, Right Upper Arm - Anterior Historically sensitive under the skin nodules which historically are improving.  Clinically there is subtle thickening compatible with fibrolipoma, fibrosis, or return folliculitis.  There is no current active inflammation. History of rheumatoid arthritis but these are not clinically Good fit for rheumatoid nodules.    Head, neck, back, upper chest, arms and legs examined.   Assessment & Plan  Panniculitis (2) Left Shoulder - Anterior; Right Upper Arm - Anterior  Patient told quite frankly there is uncertainty of her diagnosis.  I did offer to schedule deep wedge biopsy but in view of historical improvement she chooses to watch these for now.  Encounter for screening for malignant neoplasm of skin  Annual skin examination.

## 2021-08-02 ENCOUNTER — Ambulatory Visit (INDEPENDENT_AMBULATORY_CARE_PROVIDER_SITE_OTHER): Payer: Medicare Other | Admitting: *Deleted

## 2021-08-02 DIAGNOSIS — J309 Allergic rhinitis, unspecified: Secondary | ICD-10-CM | POA: Diagnosis not present

## 2021-08-02 MED ORDER — EPINEPHRINE 0.3 MG/0.3ML IJ SOAJ: 0.3000 mg | Freq: Once | INTRAMUSCULAR | 1 refills | Status: AC

## 2021-08-04 ENCOUNTER — Other Ambulatory Visit: Payer: Self-pay | Admitting: *Deleted

## 2021-08-04 ENCOUNTER — Telehealth: Payer: Self-pay | Admitting: *Deleted

## 2021-08-04 ENCOUNTER — Ambulatory Visit
Admission: RE | Admit: 2021-08-04 | Discharge: 2021-08-04 | Disposition: A | Discharge status: Home / Self Care | Payer: Medicare Other | Source: Ambulatory Visit | Attending: Emergency Medicine | Admitting: Emergency Medicine

## 2021-08-04 ENCOUNTER — Other Ambulatory Visit: Payer: Self-pay

## 2021-08-04 DIAGNOSIS — R911 Solitary pulmonary nodule: Secondary | ICD-10-CM

## 2021-08-04 MED ORDER — FAMOTIDINE 20 MG PO TABS: 20.0000 mg | ORAL_TABLET | Freq: Two times a day (BID) | ORAL | 5 refills | Status: DC

## 2021-08-04 MED ORDER — CETIRIZINE HCL 10 MG PO TABS: 10.0000 mg | ORAL_TABLET | Freq: Every day | ORAL | 5 refills | Status: DC

## 2021-08-04 NOTE — Telephone Encounter (Signed)
Called and spoke to the patient and advised of Dr. Bruna Potter recommendation. She verbalized understanding and states that she will try this, she is just concerned about the Zyrtec making her very sleeping and she may try taking half a pill first. Medications have been sent to pharmacy for patient to pick up. Advised that if the Zyrtec is not covered that it can be purchased over the counter. Patient verbalized understanding.

## 2021-08-04 NOTE — Telephone Encounter (Signed)
Patient called and stated that she had a 5+ reaction in her RUA where she received .79mL of her Grass-Tree Green vial. 2 weeks prior she had a 5+ in both arms when given .36mL of her Green vials. She does not take antihistamines unless she has too. She took a Claritin yesterday to help with her symptoms and took a Claritin 2 weeks prior after having the 5+ reaction. Please advise the best course of action.

## 2021-08-09 ENCOUNTER — Ambulatory Visit (INDEPENDENT_AMBULATORY_CARE_PROVIDER_SITE_OTHER): Payer: Medicare Other

## 2021-08-09 DIAGNOSIS — J309 Allergic rhinitis, unspecified: Secondary | ICD-10-CM

## 2021-08-13 ENCOUNTER — Other Ambulatory Visit: Payer: Self-pay | Admitting: Allergy and Immunology

## 2021-08-16 ENCOUNTER — Telehealth: Payer: Self-pay

## 2021-08-16 ENCOUNTER — Ambulatory Visit (INDEPENDENT_AMBULATORY_CARE_PROVIDER_SITE_OTHER): Payer: Medicare Other | Admitting: *Deleted

## 2021-08-16 DIAGNOSIS — J309 Allergic rhinitis, unspecified: Secondary | ICD-10-CM

## 2021-08-17 ENCOUNTER — Other Ambulatory Visit: Payer: Self-pay

## 2021-08-17 ENCOUNTER — Ambulatory Visit (INDEPENDENT_AMBULATORY_CARE_PROVIDER_SITE_OTHER): Payer: Medicare Other | Admitting: Emergency Medicine

## 2021-08-17 ENCOUNTER — Encounter: Payer: Self-pay | Admitting: Emergency Medicine

## 2021-08-17 DIAGNOSIS — R911 Solitary pulmonary nodule: Secondary | ICD-10-CM

## 2021-08-17 DIAGNOSIS — J301 Allergic rhinitis due to pollen: Secondary | ICD-10-CM | POA: Diagnosis not present

## 2021-08-17 DIAGNOSIS — J4520 Mild intermittent asthma, uncomplicated: Secondary | ICD-10-CM

## 2021-08-17 NOTE — Telephone Encounter (Signed)
Error

## 2021-08-17 NOTE — Progress Notes (Signed)
Subjective:    Patient ID: April Waters, female    DOB: 1951-07-10, 70 y.o.   MRN: [id number]  HPI 70 year old former smoker (8 pack years) with a history of rheumatoid arthritis, migraines, allergic rhinitis, childhood asthma. She has had increased allergy symptoms for the last 9 years since moving to - she had an allergy eval and was dx with burning mouth syndrome. Her abd sx improved significantly after modifying her diet based on the allergy testing. She also has some occasional wheezing since she moved to [facility].   She is referred today for a pulmonary nodule that was seen spuriously on CT of the abdomen done on 08/10/2020  CT scan of the abdomen and pelvis 08/10/2020 reviewed by me, shows no acute process in the abdomen, a possible 4 mm left lung base pulmonary nodule. There is some hazy medial R basilar infiltrate  ROV 11/03/20 --70 year old woman with minimal tobacco history, RA, allergic rhinitis, childhood asthma, allergies.  She had a new pulmonary nodule identified on CT scan of the abdomen 08/10/2020.  CT scan of the chest done 09/15/2020 reviewed by me, shows an area of atelectasis in the posterior right lower lobe adjacent to small diaphragmatic hernia/defect, 1.2 cm.  Left lower lobe pulmonary nodule measuring 6 mm (estimated at 4 mm 08/10/2020).  There are small 2-3 mm nodules noted on the right lung with calcium consistent with a granuloma.  ROV 04/08/21 --follow-up visit for 70 year old woman with minimal tobacco history, rheumatoid arthritis, migraines, allergic rhinitis and possible mild intermittent asthma (reassuring pulmonary function testing 11/03/2020).  She is now on immunotherapy.  We have been following an abnormal CT scan of the chest with some punctate nodules, one calcified.  There is a left medial basilar nodule 6 mm, small right diaphragmatic defect with some associated atelectatic change that we have planned to follow. Sometimes has some dry cough. Unable to say yet whether the  immunotherapy is helping her allergies. Uses albuterol rarely.   CT scan of the chest 03/09/2021 reviewed by me, shows no change in the left lower lobe pulmonary nodule, measured at 5 mm (more difficult to see on this scan).  Also no change in the right sided Bochdalek hernia with some fat   ROV 08/17/21 --April Waters is 70 with a minimal tobacco history.  She has a history of RA, migraines, suspected mild intermittent asthma (reassuring spirometry).  She is on immunotherapy and zyrtec for allergies.  We have been following small pulmonary nodules with repeat CT scans of the chest, last was in 03/2021.  CT chest 08/04/2021 reviewed by me, shows some stable bibasilar scarring, scattered calcified granulomas that are unchanged.  There is a previously seen medial right lower lobe area of consolidation that has resolved.  The left lower lobe anterior subpleural nodule seen previously is not visualized. She has xopenex available to use prn - uses rarely, but had to use it 2x last week.    Review of Systems As per HPI     Objective:   Physical Exam  Vitals:   08/17/21 1451  BP: 120/66  Pulse: 71  Temp: 98 F (36.7 C)  TempSrc: Oral  SpO2: 100%  Weight: 86 lb 12.8 oz (39.4 kg)  Height: 5\' 4"  (1.626 m)    Gen: Pleasant, very thin woman, in no distress,  normal affect  ENT: No lesions,  mouth clear,  oropharynx clear, no postnasal drip  Neck: No JVD, no stridor  Lungs: No use of accessory muscles, no crackles  or wheezing on normal respiration, no wheeze on forced expiration  Cardiovascular: RRR, heart sounds normal, no murmur or gallops, no peripheral edema  Musculoskeletal: joint changes from RA on both hands.   Neuro: alert, awake, non focal  Skin: Warm, no lesions or rash      Assessment & Plan:  Pulmonary nodule Chest some small calcified nodules that are all stable.  The 5 mm left lower lobe nodule has disappeared on her interval scans, cannot be seen on the most recent scan.   Probably due to her RA.  She does not need a dedicated follow-up unless there is clinical change.  We reviewed your CT chest today.  The scan is stable.  The small left lung nodule that we have been following over the last year has resolved.  This is good news.  We should not need to do a dedicated repeat CT scan unless you have a change in symptoms.  Mild intermittent asthma With reassuring spirometry.  Minimal Saba use.  No flares.  Keep your levalbuterol (Xopenex) available to use 2 puffs up to every 4 hours if needed for shortness of breath, chest tightness, wheezing. Follow with Dr. Lamonte Sakai in 12 months or sooner if you have any problems.   Allergic rhinitis due to pollen Continue your allergy shots and your Zyrtec as you have been doing them.  Baltazar Apo, MD, PhD 08/17/2021, 3:20 PM [facility] [phone] or if no answer [phone]

## 2021-08-17 NOTE — Assessment & Plan Note (Signed)
With reassuring spirometry.  Minimal Saba use.  No flares.  Keep your levalbuterol (Xopenex) available to use 2 puffs up to every 4 hours if needed for shortness of breath, chest tightness, wheezing. Follow with Dr. Lamonte Sakai in 12 months or sooner if you have any problems.

## 2021-08-17 NOTE — Assessment & Plan Note (Signed)
Chest some small calcified nodules that are all stable.  The 5 mm left lower lobe nodule has disappeared on her interval scans, cannot be seen on the most recent scan.  Probably due to her RA.  She does not need a dedicated follow-up unless there is clinical change.  We reviewed your CT chest today.  The scan is stable.  The small left lung nodule that we have been following over the last year has resolved.  This is good news.  We should not need to do a dedicated repeat CT scan unless you have a change in symptoms.

## 2021-08-17 NOTE — Assessment & Plan Note (Signed)
Continue your allergy shots and your Zyrtec as you have been doing them.

## 2021-08-17 NOTE — Patient Instructions (Signed)
We reviewed your CT chest today.  The scan is stable.  The small left lung nodule that we have been following over the last year has resolved.  This is good news.  We should not need to do a dedicated repeat CT scan unless you have a change in symptoms. Continue your allergy shots and your Zyrtec as you have been doing them. Keep your levalbuterol (Xopenex) available to use 2 puffs up to every 4 hours if needed for shortness of breath, chest tightness, wheezing. Follow with Dr. Lamonte Sakai in 12 months or sooner if you have any problems.

## 2021-08-23 ENCOUNTER — Ambulatory Visit (INDEPENDENT_AMBULATORY_CARE_PROVIDER_SITE_OTHER): Payer: Medicare Other | Admitting: *Deleted

## 2021-08-23 DIAGNOSIS — J309 Allergic rhinitis, unspecified: Secondary | ICD-10-CM

## 2021-08-30 ENCOUNTER — Ambulatory Visit (INDEPENDENT_AMBULATORY_CARE_PROVIDER_SITE_OTHER): Payer: Medicare Other | Admitting: *Deleted

## 2021-08-30 DIAGNOSIS — J309 Allergic rhinitis, unspecified: Secondary | ICD-10-CM | POA: Diagnosis not present

## 2021-09-06 ENCOUNTER — Ambulatory Visit (INDEPENDENT_AMBULATORY_CARE_PROVIDER_SITE_OTHER): Payer: Medicare Other | Admitting: *Deleted

## 2021-09-06 DIAGNOSIS — J309 Allergic rhinitis, unspecified: Secondary | ICD-10-CM

## 2021-09-19 ENCOUNTER — Ambulatory Visit (INDEPENDENT_AMBULATORY_CARE_PROVIDER_SITE_OTHER): Payer: Medicare Other | Admitting: Podiatry

## 2021-09-19 ENCOUNTER — Other Ambulatory Visit: Payer: Self-pay

## 2021-09-19 DIAGNOSIS — L84 Corns and callosities: Secondary | ICD-10-CM

## 2021-09-19 DIAGNOSIS — L600 Ingrowing nail: Secondary | ICD-10-CM

## 2021-09-19 DIAGNOSIS — I7300 Raynaud's syndrome without gangrene: Secondary | ICD-10-CM | POA: Diagnosis not present

## 2021-09-19 DIAGNOSIS — M069 Rheumatoid arthritis, unspecified: Secondary | ICD-10-CM | POA: Diagnosis not present

## 2021-09-20 ENCOUNTER — Ambulatory Visit (INDEPENDENT_AMBULATORY_CARE_PROVIDER_SITE_OTHER): Payer: Medicare Other

## 2021-09-20 DIAGNOSIS — J309 Allergic rhinitis, unspecified: Secondary | ICD-10-CM | POA: Diagnosis not present

## 2021-09-20 NOTE — Progress Notes (Signed)
°  Subjective:  Patient ID: April Waters, female    DOB: [birth year],  MRN: [id number]  Chief Complaint  Patient presents with   Ingrown Toenail      left foot great toe ingrown    71 y.o. female returns for follow-up with the above complaint. History confirmed with patient.  The ingrown on the right medial side is hurting again.  She is a history of RA and Raynaud's.  She also has calluses under the left fifth metatarsal as well as the right second toe.  She has intermittent numbness in the second and third toe on the right foot as well  Objective:  Physical Exam: no trophic changes or ulcerative lesions, normal DP and PT pulses and normal sensory exam.  Toes are cool to touch.  Right hallux nail medial border ingrown.  Porokeratosis submetatarsal 5 on the left and submetatarsal 2 on the right Assessment:   1. Callus of foot   2. Ingrowing right great toenail   3. Rheumatoid arthritis, involving unspecified site, unspecified whether rheumatoid factor present ([place])   4. Raynaud's disease without gangrene      Plan:  Patient was evaluated and treated and all questions answered.  All symptomatic hyperkeratoses were safely debrided with a sterile #15 blade to patient's level of comfort without incident. We discussed preventative and palliative care of these lesions including supportive and accommodative shoegear, padding, prefabricated and custom molded accommodative orthoses, use of a pumice stone and lotions/creams daily.  I discussed treatment of ingrown nails with her including partial permanent nail avulsion.  It is currently not that bothersome.  I debrided the corner that is ingrowing to alleviate further pain and pressure.  She will return as needed if this requires partial permanent nail avulsion  Return if symptoms worsen or fail to improve.

## 2021-09-27 ENCOUNTER — Ambulatory Visit (INDEPENDENT_AMBULATORY_CARE_PROVIDER_SITE_OTHER): Payer: Medicare Other

## 2021-09-27 DIAGNOSIS — J309 Allergic rhinitis, unspecified: Secondary | ICD-10-CM | POA: Diagnosis not present

## 2021-10-03 ENCOUNTER — Ambulatory Visit: Payer: Medicare Other | Admitting: Dermatology

## 2021-10-04 ENCOUNTER — Ambulatory Visit (INDEPENDENT_AMBULATORY_CARE_PROVIDER_SITE_OTHER): Payer: Medicare Other | Admitting: *Deleted

## 2021-10-04 DIAGNOSIS — J309 Allergic rhinitis, unspecified: Secondary | ICD-10-CM | POA: Diagnosis not present

## 2021-10-05 ENCOUNTER — Telehealth: Payer: Self-pay | Admitting: *Deleted

## 2021-10-05 NOTE — Telephone Encounter (Signed)
Patient came in yesterday to receive her allergy injections and stated that she switched toothpaste recently and she is wondering if she is allergic to it, she states that when she is brushing her teeth she just has the feeling like she needs to spit it out immediately and she becomes congestion, she denies any itching or rash. She states that the toothpaste is fluoride free and she actually has this problem with a lot of toothpaste brands.

## 2021-10-05 NOTE — Telephone Encounter (Signed)
Called and advised the patient, patient verbalized understanding.

## 2021-10-11 ENCOUNTER — Ambulatory Visit (INDEPENDENT_AMBULATORY_CARE_PROVIDER_SITE_OTHER): Payer: Medicare Other | Admitting: *Deleted

## 2021-10-11 DIAGNOSIS — J309 Allergic rhinitis, unspecified: Secondary | ICD-10-CM | POA: Diagnosis not present

## 2021-10-17 ENCOUNTER — Ambulatory Visit (INDEPENDENT_AMBULATORY_CARE_PROVIDER_SITE_OTHER): Payer: Medicare Other

## 2021-10-17 DIAGNOSIS — J309 Allergic rhinitis, unspecified: Secondary | ICD-10-CM

## 2021-10-25 ENCOUNTER — Ambulatory Visit (INDEPENDENT_AMBULATORY_CARE_PROVIDER_SITE_OTHER): Payer: Medicare Other

## 2021-10-25 DIAGNOSIS — J309 Allergic rhinitis, unspecified: Secondary | ICD-10-CM | POA: Diagnosis not present

## 2021-10-26 ENCOUNTER — Telehealth: Payer: Self-pay | Admitting: Allergy and Immunology

## 2021-10-26 NOTE — Telephone Encounter (Signed)
April Waters called in and states that she is receiving bills for her injections and she is wanting to know why.  April Waters states she has medicare and [employer] and states she shouldn't receive bills because [name] is supposed to pay what medicare doesn't.  She would like this looked into please.

## 2021-11-01 ENCOUNTER — Ambulatory Visit (INDEPENDENT_AMBULATORY_CARE_PROVIDER_SITE_OTHER): Payer: Medicare Other

## 2021-11-01 DIAGNOSIS — J309 Allergic rhinitis, unspecified: Secondary | ICD-10-CM

## 2021-11-07 ENCOUNTER — Ambulatory Visit (INDEPENDENT_AMBULATORY_CARE_PROVIDER_SITE_OTHER): Payer: Medicare Other | Admitting: *Deleted

## 2021-11-07 DIAGNOSIS — J309 Allergic rhinitis, unspecified: Secondary | ICD-10-CM | POA: Diagnosis not present

## 2021-11-14 ENCOUNTER — Ambulatory Visit (INDEPENDENT_AMBULATORY_CARE_PROVIDER_SITE_OTHER): Payer: Medicare Other

## 2021-11-14 DIAGNOSIS — J309 Allergic rhinitis, unspecified: Secondary | ICD-10-CM

## 2021-11-16 ENCOUNTER — Other Ambulatory Visit: Payer: Self-pay | Admitting: Family Medicine

## 2021-11-18 ENCOUNTER — Other Ambulatory Visit: Payer: Self-pay | Admitting: Family Medicine

## 2021-11-18 DIAGNOSIS — M810 Age-related osteoporosis without current pathological fracture: Secondary | ICD-10-CM

## 2021-11-21 ENCOUNTER — Ambulatory Visit (INDEPENDENT_AMBULATORY_CARE_PROVIDER_SITE_OTHER): Payer: Medicare Other

## 2021-11-21 DIAGNOSIS — J309 Allergic rhinitis, unspecified: Secondary | ICD-10-CM

## 2021-11-25 ENCOUNTER — Telehealth: Payer: Self-pay | Admitting: Allergy and Immunology

## 2021-11-25 NOTE — Telephone Encounter (Signed)
Patient states any time she eats banana she develops a cough and starts wheezing. She had some this morning and quickly after started coughing. I asked patient if she took any allergy medication and she denied and states she only uses her inhaler. She is curious as to why this happens when her allergy test showed no allergy to banana.  ?

## 2021-11-25 NOTE — Telephone Encounter (Signed)
Spoke with Verdis Frederickson. She says this has happened before when she's eaten bananas. I did let her know that Dr. Neldon Mc is out of the office today and will not be back until Monday, she is okay with waiting to hear back on Monday. I told Breia to avoid bananas until she hears from [place]. She did take a claritin and used her inhaler, she was feeling better when we spoke. Dr. Neldon Mc, please advise, she did say she understood that it could be a pollen cross reaction.  ?

## 2021-11-28 ENCOUNTER — Telehealth: Payer: Self-pay | Admitting: *Deleted

## 2021-11-28 NOTE — Telephone Encounter (Signed)
Patient called and stated that she has been getting bills for her injections where last year she did not get any bills. She states that her supplemental [name] takes a while to pay and sometimes needs to be resubmitted. She wanted to check on this and inquire about the bills.  ?

## 2021-11-28 NOTE — Telephone Encounter (Signed)
April Waters has been informed of this information. She doing much better today.  ?

## 2021-11-28 NOTE — Telephone Encounter (Signed)
Patient called back and stated that the bill is due to her deductible, she states that she is fine and understands the bills now and doesn't need anything further.  ?

## 2021-11-29 ENCOUNTER — Ambulatory Visit (INDEPENDENT_AMBULATORY_CARE_PROVIDER_SITE_OTHER): Payer: Medicare Other

## 2021-11-29 DIAGNOSIS — J309 Allergic rhinitis, unspecified: Secondary | ICD-10-CM

## 2021-12-01 ENCOUNTER — Ambulatory Visit (INDEPENDENT_AMBULATORY_CARE_PROVIDER_SITE_OTHER): Payer: Medicare Other | Admitting: Gastroenterology

## 2021-12-01 ENCOUNTER — Encounter: Payer: Self-pay | Admitting: Gastroenterology

## 2021-12-01 VITALS — BP 120/56 | HR 80 | Ht 62.0 in | Wt 86.4 lb

## 2021-12-01 DIAGNOSIS — R636 Underweight: Secondary | ICD-10-CM | POA: Diagnosis not present

## 2021-12-01 DIAGNOSIS — R1084 Generalized abdominal pain: Secondary | ICD-10-CM | POA: Diagnosis not present

## 2021-12-01 MED ORDER — FAMOTIDINE 20 MG PO TABS: 20.0000 mg | ORAL_TABLET | Freq: Every day | ORAL | 5 refills | Status: DC

## 2021-12-01 NOTE — Progress Notes (Signed)
? ? ?  History of Present Illness: This is a 71 year old female with chronic abdominal pain.  Her symptoms generally occur after meals.  Refer to July 27, 2020 office note.  She has multiple food and beverage intolerances, medication intolerances and has had persistent problems with a low weight, inability to gain weight.  She was previously evaluated by Dr. Michail Sermon.  Her food, beverage intolerances cause headaches or GI symptoms.  She relates she has gained 3 pounds recently. ? ?Current Medications, Allergies, Past Medical History, Past Surgical History, Family History and Social History were reviewed in [employer] record. ? ? ?Physical Exam: ?General: Well developed, well nourished, thin, no acute distress ?Head: Normocephalic and atraumatic ?Eyes: Sclerae anicteric, EOMI ?Ears: Normal auditory acuity ?Mouth: Not examined, mask on during Covid-19 pandemic ?Lungs: Clear throughout to auscultation ?Heart: Regular rate and rhythm; no murmurs, rubs or bruits ?Abdomen: Soft, non tender and non distended. No masses, hepatosplenomegaly or hernias noted. Normal Bowel sounds ?Rectal: Not done ?Musculoskeletal: Symmetrical with no gross deformities  ?Pulses:  Normal pulses noted ?Extremities: No clubbing, cyanosis, edema noted. Erythematous finger with RA deformities ?Neurological: Alert oriented x 4, grossly nonfocal ?Psychological:  Alert and cooperative. Normal mood and affect ? ? ?Assessment and Recommendations: ? ?Chronic, postprandial, generalized abdominal pain, inability to gain weight, multiple food intolerances, mild nighttime RUQ pain. IBgard 1-2 po tid 30-60 minutes AC. Famotidine 20 mg po qd. Consider EGD if symptoms persist.  Consider tertiary referral if she does not improve.  ?Malnutrition. Weight 86 lbs. BMI=15.80.  She has severe dietary limitations due to multiple food intolerances.  She is advised to consult a dietitian.  Follow-up with PCP. ?RA, untreated per patient choice.  Follow up with her PCP and rheumatologist. ?Headaches.  ?

## 2021-12-01 NOTE — Patient Instructions (Signed)
Start over the counter IBgard taking 2 capsules by mouth 30-60 minutes before meals.  ? ?We have sent the following medications to your pharmacy for you to pick up at your convenience: famotidine 20 mg daily. ? ?The [facility] providers would like to encourage you to use [facility] to communicate with providers for non-urgent requests or questions.  Due to long hold times on the telephone, sending your provider a message by [facility] may be a faster and more efficient way to get a response.  Please allow 48 business hours for a response.  Please remember that this is for non-urgent requests.  ? ?Thank you for choosing me and [facility]. ? ?Malcolm T. Dagoberto Ligas., MD., [facility] Regal ? ? ?

## 2021-12-05 ENCOUNTER — Ambulatory Visit (INDEPENDENT_AMBULATORY_CARE_PROVIDER_SITE_OTHER): Payer: Medicare Other

## 2021-12-05 DIAGNOSIS — J309 Allergic rhinitis, unspecified: Secondary | ICD-10-CM

## 2021-12-13 ENCOUNTER — Ambulatory Visit (INDEPENDENT_AMBULATORY_CARE_PROVIDER_SITE_OTHER): Payer: Medicare Other

## 2021-12-13 DIAGNOSIS — J309 Allergic rhinitis, unspecified: Secondary | ICD-10-CM | POA: Diagnosis not present

## 2021-12-19 ENCOUNTER — Ambulatory Visit (INDEPENDENT_AMBULATORY_CARE_PROVIDER_SITE_OTHER): Payer: Medicare Other

## 2021-12-19 DIAGNOSIS — J309 Allergic rhinitis, unspecified: Secondary | ICD-10-CM

## 2021-12-21 ENCOUNTER — Telehealth: Payer: Self-pay | Admitting: *Deleted

## 2021-12-21 ENCOUNTER — Other Ambulatory Visit: Payer: Self-pay | Admitting: *Deleted

## 2021-12-21 MED ORDER — OLOPATADINE HCL 0.2 % OP SOLN: 1.0000 [drp] | Freq: Every day | OPHTHALMIC | 5 refills | Status: DC | PRN

## 2021-12-21 NOTE — Telephone Encounter (Signed)
New prescription has been sent in. Called and informed patient, patient verbalized understanding.  

## 2021-12-21 NOTE — Telephone Encounter (Signed)
Patient called and stated her eyes are itchy, watery, and hurting. She is wondering if she can have an antihistamine eye drop that can help with her symptoms that would be safe for her to use.  ?

## 2021-12-26 ENCOUNTER — Ambulatory Visit (INDEPENDENT_AMBULATORY_CARE_PROVIDER_SITE_OTHER): Payer: Medicare Other

## 2021-12-26 DIAGNOSIS — J309 Allergic rhinitis, unspecified: Secondary | ICD-10-CM | POA: Diagnosis not present

## 2022-01-02 ENCOUNTER — Ambulatory Visit (INDEPENDENT_AMBULATORY_CARE_PROVIDER_SITE_OTHER): Payer: Medicare Other

## 2022-01-02 DIAGNOSIS — J309 Allergic rhinitis, unspecified: Secondary | ICD-10-CM | POA: Diagnosis not present

## 2022-01-09 ENCOUNTER — Ambulatory Visit (INDEPENDENT_AMBULATORY_CARE_PROVIDER_SITE_OTHER): Payer: Medicare Other

## 2022-01-09 DIAGNOSIS — J309 Allergic rhinitis, unspecified: Secondary | ICD-10-CM | POA: Diagnosis not present

## 2022-01-11 DIAGNOSIS — J301 Allergic rhinitis due to pollen: Secondary | ICD-10-CM | POA: Diagnosis not present

## 2022-01-11 NOTE — Progress Notes (Signed)
VIALS EXP 01-12-23 ?

## 2022-01-12 DIAGNOSIS — J302 Other seasonal allergic rhinitis: Secondary | ICD-10-CM | POA: Diagnosis not present

## 2022-01-16 ENCOUNTER — Ambulatory Visit (INDEPENDENT_AMBULATORY_CARE_PROVIDER_SITE_OTHER): Payer: Medicare Other

## 2022-01-16 DIAGNOSIS — J309 Allergic rhinitis, unspecified: Secondary | ICD-10-CM

## 2022-01-24 ENCOUNTER — Ambulatory Visit (INDEPENDENT_AMBULATORY_CARE_PROVIDER_SITE_OTHER): Payer: Medicare Other

## 2022-01-24 DIAGNOSIS — J309 Allergic rhinitis, unspecified: Secondary | ICD-10-CM | POA: Diagnosis not present

## 2022-01-31 ENCOUNTER — Ambulatory Visit (INDEPENDENT_AMBULATORY_CARE_PROVIDER_SITE_OTHER): Payer: Medicare Other

## 2022-01-31 DIAGNOSIS — J309 Allergic rhinitis, unspecified: Secondary | ICD-10-CM | POA: Diagnosis not present

## 2022-02-06 ENCOUNTER — Ambulatory Visit (INDEPENDENT_AMBULATORY_CARE_PROVIDER_SITE_OTHER): Payer: Medicare Other

## 2022-02-06 DIAGNOSIS — J309 Allergic rhinitis, unspecified: Secondary | ICD-10-CM

## 2022-02-13 ENCOUNTER — Ambulatory Visit (INDEPENDENT_AMBULATORY_CARE_PROVIDER_SITE_OTHER): Payer: Medicare Other

## 2022-02-13 DIAGNOSIS — J309 Allergic rhinitis, unspecified: Secondary | ICD-10-CM | POA: Diagnosis not present

## 2022-02-20 ENCOUNTER — Ambulatory Visit (INDEPENDENT_AMBULATORY_CARE_PROVIDER_SITE_OTHER): Payer: Medicare Other

## 2022-02-20 DIAGNOSIS — J309 Allergic rhinitis, unspecified: Secondary | ICD-10-CM

## 2022-02-21 ENCOUNTER — Encounter: Payer: Self-pay | Admitting: Allergy and Immunology

## 2022-02-21 ENCOUNTER — Ambulatory Visit (INDEPENDENT_AMBULATORY_CARE_PROVIDER_SITE_OTHER): Payer: Medicare Other | Admitting: Allergy and Immunology

## 2022-02-21 VITALS — BP 122/70 | HR 84 | Temp 98.1°F | Resp 16 | Ht 62.5 in | Wt 88.4 lb

## 2022-02-21 DIAGNOSIS — J3089 Other allergic rhinitis: Secondary | ICD-10-CM | POA: Diagnosis not present

## 2022-02-21 DIAGNOSIS — H04123 Dry eye syndrome of bilateral lacrimal glands: Secondary | ICD-10-CM

## 2022-02-21 DIAGNOSIS — T781XXD Other adverse food reactions, not elsewhere classified, subsequent encounter: Secondary | ICD-10-CM

## 2022-02-21 DIAGNOSIS — J4520 Mild intermittent asthma, uncomplicated: Secondary | ICD-10-CM

## 2022-02-21 DIAGNOSIS — T7800XA Anaphylactic reaction due to unspecified food, initial encounter: Secondary | ICD-10-CM

## 2022-02-21 DIAGNOSIS — J301 Allergic rhinitis due to pollen: Secondary | ICD-10-CM

## 2022-02-21 MED ORDER — LEVALBUTEROL TARTRATE 45 MCG/ACT IN AERO: 2.0000 | INHALATION_SPRAY | Freq: Four times a day (QID) | RESPIRATORY_TRACT | 1 refills | Status: DC | PRN

## 2022-02-21 MED ORDER — OLOPATADINE HCL 0.2 % OP SOLN: 1.0000 [drp] | Freq: Every day | OPHTHALMIC | 5 refills | Status: AC | PRN

## 2022-02-21 MED ORDER — CETIRIZINE HCL 10 MG PO TABS: 10.0000 mg | ORAL_TABLET | Freq: Every day | ORAL | 5 refills | Status: DC | PRN

## 2022-02-21 MED ORDER — EPINEPHRINE 0.3 MG/0.3ML IJ SOAJ: 3.0000 mg | INTRAMUSCULAR | 1 refills | Status: DC | PRN

## 2022-02-21 MED ORDER — SYSTANE COMPLETE 0.6 % OP SOLN: 1.0000 [drp] | Freq: Four times a day (QID) | OPHTHALMIC | 5 refills | Status: AC

## 2022-02-21 NOTE — Patient Instructions (Signed)
  1.  Continue immunotherapy with slow buildup using schedule A  2.  If needed:   A. Xopenx HFA - 2 inhalations every 4-6 hours  B. OTC antihistamine - claritin / zyrtec 10 mg - 1 tablet 1 time per day  C. Epi-pen, benadryl, MD/ER evalution for allergic reaction.  D. Pataday -1 drop each eye 1 time per day   E. Systane -multiple times per day  3. Return to clinic in 6 months or earlier if problem.

## 2022-02-21 NOTE — Progress Notes (Unsigned)
[place]   Follow-up Note  Referring Provider: Maury Dus, MD Primary Provider: Maury Dus, MD Date of Office Visit: 02/21/2022  Subjective:   April Waters (DOB: 01-Sep-1951) is a 71 y.o. female who returns to the [facility] and [place] on 02/21/2022 in re-evaluation of the following:  HPI: Summerlyn returns to this clinic in reevaluation of allergic rhinoconjunctivitis, intermittent asthma, oral allergy syndrome and adverse response to foods, and dry eye syndrome.  I last saw her in this clinic on 22 February 2021.  Her asthma has been under excellent control and she rarely uses a short acting bronchodilator.  Her nose has been doing pretty well even as she is gone through the springtime season.  It does not sound as though she is required a systemic steroid or an antibiotic for any type of airway issue.  What has been problem is her eyes.  She tried Restasis and developed an adverse response secondary to that administration.  She is now using some antihistamine eyedrops which is working quite well.  She also adds in some lubricating drops as well.  Her immunotherapy has been slow to progress.  She has had some systemic reactions in the form of a diffuse pruritic dermatitis after receiving immunotherapy.  She develops large local reactions as well to her immunotherapy.  She has noticed that when eating bananas and sweet potatoes she does develop issues with breathing and she has some coughing and wheezing that may last an hour or 2 in response to a short acting bronchodilator.  She is obviously remaining away from these foods at this point in time.  Allergies as of 02/21/2022       Reactions   Cardizem [diltiazem Hcl]    Codeine    Penicillins    Vicodin [hydrocodone-acetaminophen] Other (See Comments)   Muscle Weakness   Yeast Other (See Comments)        Medication List    acetaminophen 500 MG tablet Commonly known as:  TYLENOL Take 500 mg by mouth every 6 (six) hours as needed. Reported on 11/29/2015   ascorbic acid 500 MG tablet Commonly known as: VITAMIN C Take 500 mg by mouth daily.   Calcium 600/Vitamin D 600-10 MG-MCG Chew Generic drug: Calcium Carb-Cholecalciferol 1 tablet with meals   EPINEPHrine 0.3 mg/0.3 mL Soaj injection Commonly known as: EPI-PEN Inject 3 mLs into the skin as needed.   famotidine 20 MG tablet Commonly known as: Pepcid Take 1 tablet (20 mg total) by mouth daily.   levalbuterol 45 MCG/ACT inhaler Commonly known as: XOPENEX HFA INHALE 2 PUFFS INTO THE LUNGS EVERY 6 HOURS AS NEEDED FOR WHEEZING (COUGH).   Magnesium 300 MG Caps Take 1 capsule by mouth daily.   Olopatadine HCl 0.2 % Soln Commonly known as: Pataday Apply 1 drop to eye daily as needed.   peppermint oil liquid        Past Medical History:  Diagnosis Date   Allergy    Asthma    C. difficile enteritis    Chicken pox    Common migraine with intractable migraine 05/26/2015   [facility] Patient infection 1986   lasted about a year late diagnosis   Measles    Osteoporosis    Rheumatoid arthritis ([place])     Past Surgical History:  Procedure Laterality Date   ANTERIOR CRUCIATE LIGAMENT REPAIR  09/04/1996   right   BREAST CYST EXCISION Right    when she was 15  cebaceous cyst     right breast - age 72   fluid knee  09/04/1992   left   HERNIA REPAIR     bilateral inguinal 2 months   KIDNEY STONE SURGERY     1980   KNEE SURGERY  09/04/1988   medial meniscous right   LAPAROSCOPY  09/05/1991   endometriosis   nasal polyps     age 66    Review of systems negative except as noted in HPI / PMHx or noted below:  Review of Systems  Constitutional: Negative.   HENT: Negative.    Eyes: Negative.   Respiratory: Negative.    Cardiovascular: Negative.   Gastrointestinal: Negative.   Genitourinary: Negative.   Musculoskeletal: Negative.   Skin: Negative.   Neurological: Negative.    Endo/Heme/Allergies: Negative.   Psychiatric/Behavioral: Negative.       Objective:   Vitals:   02/21/22 1352  BP: 122/70  Pulse: 84  Resp: 16  Temp: 98.1 F (36.7 C)  SpO2: 98%   Height: 5' 2.5" (158.8 cm)  Weight: 88 lb 6.4 oz (40.1 kg)   Physical Exam Constitutional:      Appearance: She is not diaphoretic.  HENT:     Head: Normocephalic.     Right Ear: Tympanic membrane, ear canal and external ear normal.     Left Ear: Tympanic membrane, ear canal and external ear normal.     Nose: Nose normal. No mucosal edema or rhinorrhea.     Mouth/Throat:     Pharynx: Uvula midline. No oropharyngeal exudate.  Eyes:     Conjunctiva/sclera: Conjunctivae normal.  Neck:     Thyroid: No thyromegaly.     Trachea: Trachea normal. No tracheal tenderness or tracheal deviation.  Cardiovascular:     Rate and Rhythm: Normal rate and regular rhythm.     Heart sounds: Normal heart sounds, S1 normal and S2 normal. No murmur heard. Pulmonary:     Effort: No respiratory distress.     Breath sounds: Normal breath sounds. No stridor. No wheezing or rales.  Lymphadenopathy:     Head:     Right side of head: No tonsillar adenopathy.     Left side of head: No tonsillar adenopathy.     Cervical: No cervical adenopathy.  Skin:    Findings: No erythema or rash.     Nails: There is no clubbing.  Neurological:     Mental Status: She is alert.     Diagnostics:    Spirometry was performed and demonstrated an FEV1 of 2.04 at 97 % of predicted.  The patient had an Asthma Control Test with the following results:  .    Assessment and Plan:   1. Perennial allergic rhinitis   2. Seasonal allergic rhinitis due to pollen   3. Asthma, mild intermittent, well-controlled   4. Pollen-food allergy, subsequent encounter   5. Allergy with anaphylaxis due to food   6. Dry eye syndrome of both eyes     1.  Continue immunotherapy with slow buildup using schedule A  2.  If needed:   A. Xopenx HFA  - 2 inhalations every 4-6 hours  B. OTC antihistamine - claritin / zyrtec 10 mg - 1 tablet 1 time per day  C. Epi-pen, benadryl, MD/ER evalution for allergic reaction.  D. Pataday -1 drop each eye 1 time per day   E. Systane -multiple times per day  3. Return to clinic in 6 months or earlier if problem.   We  will continue to have Verdis Frederickson use immunotherapy and will slowly build up to maintenance dosing using schedule a.  She should get some significant improvement regarding [place] airway issues and eye issues but also her oral allergy syndrome while using immunotherapy.  She has a selections of agents that she can utilize should they be required as noted above.  I will see her back in this clinic in 6 months or earlier if there is a problem.  Allena Katz, MD Allergy / Immunology [place]

## 2022-02-22 ENCOUNTER — Encounter: Payer: Self-pay | Admitting: Allergy and Immunology

## 2022-02-28 ENCOUNTER — Ambulatory Visit (INDEPENDENT_AMBULATORY_CARE_PROVIDER_SITE_OTHER): Payer: Medicare Other

## 2022-02-28 DIAGNOSIS — J309 Allergic rhinitis, unspecified: Secondary | ICD-10-CM | POA: Diagnosis not present

## 2022-02-28 NOTE — Progress Notes (Signed)
Patient called stating that she has red whelps on her arms and chest. Patient came back into the office to have Dr. Lucie Leather take a look at it. Patient was given Claritin and prednisone. Per Dr. Lucie Leather she should take 20mg  of Claritin and 20mg  of famotidine day of injection.

## 2022-03-08 ENCOUNTER — Ambulatory Visit (INDEPENDENT_AMBULATORY_CARE_PROVIDER_SITE_OTHER): Payer: Medicare Other

## 2022-03-08 DIAGNOSIS — J309 Allergic rhinitis, unspecified: Secondary | ICD-10-CM

## 2022-03-08 NOTE — Progress Notes (Signed)
Patient received red 0.81m injection today and while waiting in the car noted some coughing and wheezing. She took xopenex 1 puff and decided to come back into the office.  Vitals stable and clear lungs with no signs of oropharyngeal abnormalities on exam. No rash.  Advised her to take another xopenex 1 puff and the coughing subsided significantly and is feeling much better.  She states that since she has been on the red vial she noted 2 separate occasions of chest tightness/pain and large localized erythema with itching under her armpits.   Advised her that she can take xopenex 2-4 puffs additionally if needed and she did have her Epipen on hand just in case for severe reactions.  I will route this message to Dr. KNeldon Mcwho is managing her AIT.

## 2022-03-14 ENCOUNTER — Ambulatory Visit (INDEPENDENT_AMBULATORY_CARE_PROVIDER_SITE_OTHER): Payer: Medicare Other

## 2022-03-14 DIAGNOSIS — J309 Allergic rhinitis, unspecified: Secondary | ICD-10-CM

## 2022-03-16 ENCOUNTER — Telehealth: Payer: Self-pay | Admitting: Emergency Medicine

## 2022-03-16 NOTE — Telephone Encounter (Signed)
Please let her know that I looked at all of her CT scans of the chest and abdomen >> I do not see any kind of hernia. Not sure how I conveyed that to her, but I don't think a hernia is present

## 2022-03-16 NOTE — Telephone Encounter (Signed)
Called and spoke with pt who states she wants to know if RB could further review the CT that she had done 08/2021. Pt said she was told by Dr. Lamonte Sakai that she had a hernia but she wants to know where the hernia was if it was hiatal hernia, abdominal hernia, or where as she states she has been coughing a lot and wants to know if this could be due to the location of the hernia.  Dr. Lamonte Sakai, please advise.

## 2022-03-17 NOTE — Telephone Encounter (Signed)
Called patient back and let her know the information from Dr Lamonte Sakai about her CT scan. Patient verbalized understanding. Nothing further needed

## 2022-03-21 ENCOUNTER — Ambulatory Visit (INDEPENDENT_AMBULATORY_CARE_PROVIDER_SITE_OTHER): Payer: Medicare Other

## 2022-03-21 DIAGNOSIS — J309 Allergic rhinitis, unspecified: Secondary | ICD-10-CM | POA: Diagnosis not present

## 2022-03-23 ENCOUNTER — Ambulatory Visit: Payer: Medicare Other | Admitting: Podiatry

## 2022-03-28 ENCOUNTER — Ambulatory Visit (INDEPENDENT_AMBULATORY_CARE_PROVIDER_SITE_OTHER): Payer: Medicare Other

## 2022-03-28 ENCOUNTER — Telehealth: Payer: Self-pay

## 2022-03-28 DIAGNOSIS — J309 Allergic rhinitis, unspecified: Secondary | ICD-10-CM | POA: Diagnosis not present

## 2022-03-28 NOTE — Telephone Encounter (Signed)
Patient came in today to get her allergy injections. Patient stated that she notices she starts coughing once we get to 0.1 of her Red vial. Patient is requesting to freeze at 0.05 since she doesn't have any issues with that dose. Please advise.Marland Kitchen

## 2022-04-03 ENCOUNTER — Ambulatory Visit (INDEPENDENT_AMBULATORY_CARE_PROVIDER_SITE_OTHER): Payer: Medicare Other | Admitting: Podiatry

## 2022-04-03 ENCOUNTER — Ambulatory Visit (INDEPENDENT_AMBULATORY_CARE_PROVIDER_SITE_OTHER): Payer: Medicare Other

## 2022-04-03 DIAGNOSIS — I7300 Raynaud's syndrome without gangrene: Secondary | ICD-10-CM | POA: Diagnosis not present

## 2022-04-03 DIAGNOSIS — J309 Allergic rhinitis, unspecified: Secondary | ICD-10-CM | POA: Diagnosis not present

## 2022-04-03 DIAGNOSIS — L600 Ingrowing nail: Secondary | ICD-10-CM | POA: Diagnosis not present

## 2022-04-03 DIAGNOSIS — L84 Corns and callosities: Secondary | ICD-10-CM | POA: Diagnosis not present

## 2022-04-03 NOTE — Progress Notes (Signed)
  Subjective:  Patient ID: [name], female    DOB: December 21, 1950,  MRN: [id number]  Chief Complaint  Patient presents with   Ingrown Toenail    Left great toe ingrown- medial border   Callouses    Callouses to bilateral 5th toe- ball of foot    71 y.o. female returns for follow-up with the above complaint. History confirmed with patient.  Right side is doing well.  The left great toenail is ingrowing on the inside.  The calluses are painful again.  Objective:  Physical Exam: no trophic changes or ulcerative lesions, normal DP and PT pulses and normal sensory exam.  Toes are cool to touch.  Right hallux nail medial border ingrown.  Porokeratosis submetatarsal 5 on the left and submetatarsal 2 on the right Assessment:   1. Ingrowing left great toenail   2. Raynaud's disease without gangrene   3. Callus of foot      Plan:  Patient was evaluated and treated and all questions answered.  All symptomatic hyperkeratoses were safely debrided with a sterile #15 blade to patient's level of comfort without incident. We discussed preventative and palliative care of these lesions including supportive and accommodative shoegear, padding, prefabricated and custom molded accommodative orthoses, use of a pumice stone and lotions/creams daily.  Slant back debridement of the left hallux nail medial border was performed, this alleviated her pain today  Return if symptoms worsen or fail to improve.

## 2022-04-11 ENCOUNTER — Other Ambulatory Visit: Payer: Self-pay

## 2022-04-11 ENCOUNTER — Ambulatory Visit (INDEPENDENT_AMBULATORY_CARE_PROVIDER_SITE_OTHER): Payer: Medicare Other | Admitting: *Deleted

## 2022-04-11 DIAGNOSIS — J309 Allergic rhinitis, unspecified: Secondary | ICD-10-CM

## 2022-04-11 MED ORDER — MONTELUKAST SODIUM 10 MG PO TABS: 10.0000 mg | ORAL_TABLET | Freq: Every day | ORAL | 1 refills | Status: DC

## 2022-04-11 MED ORDER — FAMOTIDINE 20 MG PO TABS: 20.0000 mg | ORAL_TABLET | Freq: Every day | ORAL | 1 refills | Status: DC

## 2022-04-11 MED ORDER — LORATADINE 10 MG PO TABS: 10.0000 mg | ORAL_TABLET | Freq: Every day | ORAL | 1 refills | Status: AC

## 2022-04-11 NOTE — Progress Notes (Signed)
Per Dr. Neldon Mc - patient is to take the following before administration of allergy injections:  Famotidine '20mg'$  - 1 tablet orally Loratidine (Claritin) 10 mg - 1 tablet orally Montelukast (Singulair) 10 mg - 1 tablet orally  The patient was advised of the update before leaving the office - patient verbalized understanding of new plan, no further questions.  The medications will be electronically sent to pharmacy on file.

## 2022-04-11 NOTE — Addendum Note (Signed)
Addended by: Tommas Olp B on: 04/11/2022 02:50 PM   Modules accepted: Orders

## 2022-04-14 ENCOUNTER — Telehealth: Payer: Self-pay

## 2022-04-14 NOTE — Telephone Encounter (Signed)
Patient called in - DOB verified - stated she has done research on Montelukast (Singulair) 10 mg and due to her sensitivity to medications and the side effects of Montelukast, she is not taking this medication. Patient stated she will take Famotidine (Pepcid) 20 mg 1 tablet orally, Loratadine (Claritin) 10 mg 1 tablet orally before administration of allergy injections.  Patient requested message be forwarded to provider to let him know.

## 2022-04-17 NOTE — Telephone Encounter (Signed)
Are you ok with the patient receiving Claritin and Pepcid and no Montelukast (due to side effects), or do you recommend something different for a plan for her injections.

## 2022-04-17 NOTE — Telephone Encounter (Signed)
Called and informed patient, patient verbalized understanding.  

## 2022-04-18 ENCOUNTER — Ambulatory Visit (INDEPENDENT_AMBULATORY_CARE_PROVIDER_SITE_OTHER): Payer: Medicare Other | Admitting: *Deleted

## 2022-04-18 DIAGNOSIS — J309 Allergic rhinitis, unspecified: Secondary | ICD-10-CM

## 2022-04-25 ENCOUNTER — Ambulatory Visit (INDEPENDENT_AMBULATORY_CARE_PROVIDER_SITE_OTHER): Payer: Medicare Other | Admitting: *Deleted

## 2022-04-25 DIAGNOSIS — J309 Allergic rhinitis, unspecified: Secondary | ICD-10-CM | POA: Diagnosis not present

## 2022-04-27 ENCOUNTER — Ambulatory Visit: Payer: Medicare Other | Admitting: Allergy and Immunology

## 2022-04-27 ENCOUNTER — Encounter: Payer: Self-pay | Admitting: Allergy and Immunology

## 2022-04-27 VITALS — BP 138/78 | HR 104 | Resp 20

## 2022-04-27 DIAGNOSIS — J3089 Other allergic rhinitis: Secondary | ICD-10-CM

## 2022-04-27 DIAGNOSIS — J4520 Mild intermittent asthma, uncomplicated: Secondary | ICD-10-CM

## 2022-04-27 DIAGNOSIS — J301 Allergic rhinitis due to pollen: Secondary | ICD-10-CM

## 2022-04-27 DIAGNOSIS — T7800XA Anaphylactic reaction due to unspecified food, initial encounter: Secondary | ICD-10-CM

## 2022-04-27 DIAGNOSIS — T781XXD Other adverse food reactions, not elsewhere classified, subsequent encounter: Secondary | ICD-10-CM

## 2022-04-27 NOTE — Patient Instructions (Addendum)
  1.  Continue immunotherapy with Green 0.5 as maximal dose  2. Use the following on morning of immunotherapy:   A. Fexofenadine 180 mg - 1 tablet  B. Cimetidine 200 mg - 1 tablet  2.  If needed:   A. Xopenx HFA - 2 inhalations every 4-6 hours  B. OTC antihistamine - claritin / zyrtec 10 mg - 1 tablet 1 time per day  C. Epi-pen, benadryl, MD/ER evalution for allergic reaction.  D. Pataday -1 drop each eye 1 time per day   E. Systane -multiple times per day  3. Return to clinic in 6 months or earlier if problem.   4. Consider fall flu vaccine and RSV vaccine

## 2022-04-27 NOTE — Progress Notes (Signed)
[place]   Follow-up Note  Referring Provider: Maury Dus, MD Primary Provider: Maury Dus, MD Date of Office Visit: 04/27/2022  Subjective:   April Waters (DOB: 1951-03-05) is a 70 y.o. female who returns to the Allergy and [place] on 04/27/2022 in re-evaluation of the following:  HPI: April Waters returns to this clinic in evaluation of allergic rhinoconjunctivitis, asthma, oral allergy syndrome, and adverse response to foods including bananas and sweet potatoes.  I last saw her in this clinic for a scheduled appointment on 21 February 2022.  She is undergoing a course of immunotherapy and she has been having problems utilizing this form of treatment.  Soon after receiving immunotherapy she develops an increased heart rate and then she finds it hard to breathe and then she develops coughing.  This has been a repetitive issue and during her previous injection we informed her that she will need to use Claritin and famotidine prior to receiving her immunotherapy.  She took 10 mg of Claritin and 20 mg of famotidine about an hour before her last injection this week and she actually did a lot better but, both Claritin and famotidine give her a migraine headache that can last up to a day.  Allergies as of 04/27/2022       Reactions   Cardizem [diltiazem Hcl]    Codeine    Penicillins    Vicodin [hydrocodone-acetaminophen] Other (See Comments)   Muscle Weakness   Yeast Other (See Comments)        Medication List    acetaminophen 500 MG tablet Commonly known as: TYLENOL Take 500 mg by mouth every 6 (six) hours as needed. Reported on 11/29/2015   ascorbic acid 500 MG tablet Commonly known as: VITAMIN C Take 500 mg by mouth daily.   B-12 PO Take by mouth.   B2 PO Take by mouth.   EPINEPHrine 0.3 mg/0.3 mL Soaj injection Commonly known as: EPI-PEN Inject 3 mg into the skin as needed.   famotidine 20 MG tablet Commonly  known as: Pepcid Take 1 tablet (20 mg total) by mouth daily.   levalbuterol 45 MCG/ACT inhaler Commonly known as: XOPENEX HFA Inhale 2 puffs into the lungs every 6 (six) hours as needed for wheezing.   loratadine 10 MG tablet Commonly known as: CLARITIN Take 1 tablet (10 mg total) by mouth daily.   Magnesium 300 MG Caps Take 1 capsule by mouth daily.   Olopatadine HCl 0.2 % Soln Commonly known as: Pataday Apply 1 drop to eye daily as needed.   peppermint oil liquid   Systane Complete 0.6 % Soln Generic drug: Propylene Glycol Apply 1 drop to eye in the morning, at noon, in the evening, and at bedtime.   VITAMIN K PO Take by mouth.    Past Medical History:  Diagnosis Date   Allergy    Asthma    C. difficile enteritis    Chicken pox    Common migraine with intractable migraine 05/26/2015   [facility] Patient infection 1986   lasted about a year late diagnosis   Measles    Osteoporosis    Rheumatoid arthritis [facility])     Past Surgical History:  Procedure Laterality Date   ANTERIOR CRUCIATE LIGAMENT REPAIR  09/04/1996   right   BREAST CYST EXCISION Right    when she was 15   cebaceous cyst     right breast - age 38   fluid knee  09/04/1992  left   HERNIA REPAIR     bilateral inguinal 2 months   [place]   KNEE SURGERY  09/04/1988   medial meniscous right   LAPAROSCOPY  09/05/1991   endometriosis   nasal polyps     age 24    Review of systems negative except as noted in HPI / PMHx or noted below:  Review of Systems  Constitutional: Negative.   HENT: Negative.    Eyes: Negative.   Respiratory: Negative.    Cardiovascular: Negative.   Gastrointestinal: Negative.   Genitourinary: Negative.   Musculoskeletal: Negative.   Skin: Negative.   Neurological: Negative.   Endo/Heme/Allergies: Negative.   Psychiatric/Behavioral: Negative.       Objective:   Vitals:   04/27/22 1639  BP: 138/78  Pulse: (!) 104  Resp: 20  SpO2: 98%           Physical Exam -deferred  Diagnostics: none  Assessment and Plan:   1. Perennial allergic rhinitis   2. Seasonal allergic rhinitis due to pollen   3. Asthma, mild intermittent, well-controlled   4. Pollen-food allergy, subsequent encounter   5. Allergy with anaphylaxis due to food    1.  Continue immunotherapy with Green 0.5 as maximal dose  2. Use the following on morning of immunotherapy:   A. Fexofenadine 180 mg - 1 tablet  B. Cimetidine 200 mg - 1 tablet  2.  If needed:   A. Xopenx HFA - 2 inhalations every 4-6 hours  B. OTC antihistamine - claritin / zyrtec 10 mg - 1 tablet 1 time per day  C. Epi-pen, benadryl, MD/ER evalution for allergic reaction.  D. Pataday -1 drop each eye 1 time per day   E. Systane -multiple times per day  3. Return to clinic in 6 months or earlier if problem.   4. Consider fall flu vaccine and RSV vaccine  April Waters will use an H1 and H2 receptor blocker prior to receiving immunotherapy and we will keep her on a maintenance dose of tenfold less concentrated extract from her usual maintenance dose.  Assuming she does well with this plan we will see her back in this clinic in 6 months or earlier if there is a problem.  Allena Katz, MD Allergy / Immunology [place]

## 2022-05-02 ENCOUNTER — Ambulatory Visit (INDEPENDENT_AMBULATORY_CARE_PROVIDER_SITE_OTHER): Payer: Medicare Other | Admitting: *Deleted

## 2022-05-02 DIAGNOSIS — J309 Allergic rhinitis, unspecified: Secondary | ICD-10-CM | POA: Diagnosis not present

## 2022-05-04 ENCOUNTER — Ambulatory Visit
Admission: RE | Admit: 2022-05-04 | Discharge: 2022-05-04 | Disposition: A | Discharge status: Home / Self Care | Payer: Medicare Other | Source: Ambulatory Visit | Attending: Family Medicine | Admitting: Family Medicine

## 2022-05-04 DIAGNOSIS — M810 Age-related osteoporosis without current pathological fracture: Secondary | ICD-10-CM

## 2022-05-09 ENCOUNTER — Ambulatory Visit (INDEPENDENT_AMBULATORY_CARE_PROVIDER_SITE_OTHER): Payer: Medicare Other

## 2022-05-09 ENCOUNTER — Telehealth: Payer: Self-pay

## 2022-05-09 DIAGNOSIS — J309 Allergic rhinitis, unspecified: Secondary | ICD-10-CM

## 2022-05-09 NOTE — Telephone Encounter (Signed)
Patient came in today for her injections. I asked patient if she took all of her medications today to prevent any reactions. I asked patient when she took her Fexofenadine and Cimetidine. Patient then stated she did not take the cimetidine as she had a bad headache which caused her to see spots of light. I informed patient that it is pertinent to stick with the regimen Dr. Neldon Mc has for he wants the best for her during her immunotherapy sessions. Patient agreed and stated that she took the Allegra and Famotidine. I reminded patient that she voiced previously that famotidine gave her headaches. Patient stated that she thinks famotidine does give her headaches. I advised patient that is critical for her to follow the instructions of Dr. Neldon Mc and that if she does experience headaches she can also take a tylenol to help control them.Patient requested that Dr. Neldon Mc be advised of the cimetidine. Please advise on what to do further. Thank you.

## 2022-05-16 ENCOUNTER — Ambulatory Visit (INDEPENDENT_AMBULATORY_CARE_PROVIDER_SITE_OTHER): Payer: Medicare Other | Admitting: *Deleted

## 2022-05-16 DIAGNOSIS — J309 Allergic rhinitis, unspecified: Secondary | ICD-10-CM

## 2022-05-17 ENCOUNTER — Other Ambulatory Visit: Payer: Self-pay | Admitting: Family Medicine

## 2022-05-17 DIAGNOSIS — Z1231 Encounter for screening mammogram for malignant neoplasm of breast: Secondary | ICD-10-CM

## 2022-05-23 ENCOUNTER — Ambulatory Visit (INDEPENDENT_AMBULATORY_CARE_PROVIDER_SITE_OTHER): Payer: Medicare Other

## 2022-05-23 DIAGNOSIS — J309 Allergic rhinitis, unspecified: Secondary | ICD-10-CM

## 2022-05-30 ENCOUNTER — Ambulatory Visit (INDEPENDENT_AMBULATORY_CARE_PROVIDER_SITE_OTHER): Payer: Medicare Other | Admitting: *Deleted

## 2022-05-30 DIAGNOSIS — J309 Allergic rhinitis, unspecified: Secondary | ICD-10-CM | POA: Diagnosis not present

## 2022-06-06 ENCOUNTER — Ambulatory Visit (INDEPENDENT_AMBULATORY_CARE_PROVIDER_SITE_OTHER): Payer: Medicare Other

## 2022-06-06 ENCOUNTER — Ambulatory Visit: Payer: Medicare Other

## 2022-06-06 DIAGNOSIS — J309 Allergic rhinitis, unspecified: Secondary | ICD-10-CM

## 2022-06-13 ENCOUNTER — Ambulatory Visit (INDEPENDENT_AMBULATORY_CARE_PROVIDER_SITE_OTHER): Payer: Medicare Other | Admitting: *Deleted

## 2022-06-13 DIAGNOSIS — J309 Allergic rhinitis, unspecified: Secondary | ICD-10-CM | POA: Diagnosis not present

## 2022-06-20 ENCOUNTER — Ambulatory Visit (INDEPENDENT_AMBULATORY_CARE_PROVIDER_SITE_OTHER): Payer: Medicare Other

## 2022-06-20 DIAGNOSIS — J309 Allergic rhinitis, unspecified: Secondary | ICD-10-CM

## 2022-06-27 ENCOUNTER — Ambulatory Visit (INDEPENDENT_AMBULATORY_CARE_PROVIDER_SITE_OTHER): Payer: Medicare Other | Admitting: *Deleted

## 2022-06-27 DIAGNOSIS — J309 Allergic rhinitis, unspecified: Secondary | ICD-10-CM | POA: Diagnosis not present

## 2022-06-27 MED ORDER — EPINEPHRINE 0.3 MG/0.3ML IJ SOAJ: 0.3000 mg | INTRAMUSCULAR | 1 refills | Status: AC | PRN

## 2022-07-04 ENCOUNTER — Ambulatory Visit (INDEPENDENT_AMBULATORY_CARE_PROVIDER_SITE_OTHER): Payer: Medicare Other | Admitting: *Deleted

## 2022-07-04 DIAGNOSIS — J309 Allergic rhinitis, unspecified: Secondary | ICD-10-CM | POA: Diagnosis not present

## 2022-07-11 ENCOUNTER — Ambulatory Visit (INDEPENDENT_AMBULATORY_CARE_PROVIDER_SITE_OTHER): Payer: Medicare Other

## 2022-07-11 DIAGNOSIS — J309 Allergic rhinitis, unspecified: Secondary | ICD-10-CM | POA: Diagnosis not present

## 2022-07-17 ENCOUNTER — Ambulatory Visit (INDEPENDENT_AMBULATORY_CARE_PROVIDER_SITE_OTHER): Payer: Medicare Other | Admitting: *Deleted

## 2022-07-17 DIAGNOSIS — J309 Allergic rhinitis, unspecified: Secondary | ICD-10-CM

## 2022-07-24 ENCOUNTER — Ambulatory Visit: Payer: Medicare Other

## 2022-07-25 ENCOUNTER — Ambulatory Visit (INDEPENDENT_AMBULATORY_CARE_PROVIDER_SITE_OTHER): Payer: Medicare Other

## 2022-07-25 DIAGNOSIS — J309 Allergic rhinitis, unspecified: Secondary | ICD-10-CM

## 2022-08-01 ENCOUNTER — Ambulatory Visit (INDEPENDENT_AMBULATORY_CARE_PROVIDER_SITE_OTHER): Payer: Medicare Other | Admitting: *Deleted

## 2022-08-01 DIAGNOSIS — J309 Allergic rhinitis, unspecified: Secondary | ICD-10-CM | POA: Diagnosis not present

## 2022-08-02 ENCOUNTER — Encounter: Payer: Self-pay | Admitting: Emergency Medicine

## 2022-08-02 ENCOUNTER — Ambulatory Visit (INDEPENDENT_AMBULATORY_CARE_PROVIDER_SITE_OTHER): Payer: Medicare Other | Admitting: Emergency Medicine

## 2022-08-02 VITALS — BP 118/68 | HR 78 | Temp 98.5°F | Ht 62.0 in | Wt 90.0 lb

## 2022-08-02 DIAGNOSIS — J4520 Mild intermittent asthma, uncomplicated: Secondary | ICD-10-CM

## 2022-08-02 DIAGNOSIS — R911 Solitary pulmonary nodule: Secondary | ICD-10-CM

## 2022-08-02 NOTE — Progress Notes (Signed)
   Subjective:    Patient ID: April Waters, female    DOB: [birth year], 71 y.o.   MRN: [id number]  HPI   ROV 08/17/21 --April Waters is 73 with a minimal tobacco history.  She has a history of RA, migraines, suspected mild intermittent asthma (reassuring spirometry).  She is on immunotherapy and zyrtec for allergies.  We have been following small pulmonary nodules with repeat CT scans of the chest, last was in 03/2021.  CT chest 08/04/2021 reviewed by me, shows some stable bibasilar scarring, scattered calcified granulomas that are unchanged.  There is a previously seen medial right lower lobe area of consolidation that has resolved.  The left lower lobe anterior subpleural nodule seen previously is not visualized. She has xopenex available to use prn - uses rarely, but had to use it 2x last week.   ROV 08/02/22 --follow-up visit for 71 year old woman with a minimal tobacco history, rheumatoid arthritis, migraines and allergic mild intermittent asthma.  She is on Zyrtec and immunotherapy.  She also has a history of waxing and waning pulmonary nodular disease with stable imaging. She is under a lot of stress and does a lot of work taking care to her husband w dementia. She reports that she has been experiencing increased allergy sx, has some sensitivities to some foods. She is using xopenex about 1x a week. She was given prednisone about 6 months ago due to increased cough and SOB - unable to tolerate.  Loratadine, pepcid.    Review of Systems As per HPI     Objective:   Physical Exam  Vitals:   08/02/22 1343  BP: 118/68  Pulse: 78  Temp: 98.5 F (36.9 C)  TempSrc: Oral  SpO2: 98%  Weight: 90 lb (40.8 kg)  Height: '5\' 2"'$  (1.575 m)    Gen: Pleasant, very thin woman, in no distress,  normal affect  ENT: No lesions,  mouth clear,  oropharynx clear, no postnasal drip  Neck: No JVD, no stridor  Lungs: No use of accessory muscles, no crackles or wheezing on normal respiration, no wheeze  on forced expiration  Cardiovascular: RRR, heart sounds normal, no murmur or gallops, no peripheral edema  Musculoskeletal: joint changes from RA on both hands.   Neuro: alert, awake, non focal  Skin: Warm, no lesions or rash      Assessment & Plan:  Pulmonary nodule Waxing and waning pulmonary nodular disease, likely related to her rheumatoid.  She is clinically stable, has not had any decline in functional capacity, no new respiratory symptoms.  I think we can defer any repeat chest imaging given clinical stability and stability of her imaging over the last several years.  Mild intermittent asthma Keep your levalbuterol (Xopenex) available to use 2 puffs up to every 4 hours if needed for shortness of breath, chest tightness, wheezing. Continue to get your immunotherapy shots with your allergist. Follow April Waters in 1 year or sooner if you have any problems.  Baltazar Apo, MD, PhD 08/02/2022, 4:10 PM [facility] [phone] or if no answer [phone]

## 2022-08-02 NOTE — Assessment & Plan Note (Signed)
Keep your levalbuterol (Xopenex) available to use 2 puffs up to every 4 hours if needed for shortness of breath, chest tightness, wheezing. Continue to get your immunotherapy shots with your allergist. Follow Dr. Lamonte Sakai in 1 year or sooner if you have any problems.

## 2022-08-02 NOTE — Patient Instructions (Signed)
We will hold off on performing any chest imaging at this time Keep your levalbuterol (Xopenex) available to use 2 puffs up to every 4 hours if needed for shortness of breath, chest tightness, wheezing. Continue to get your immunotherapy shots with your allergist. Follow Dr. Lamonte Sakai in 1 year or sooner if you have any problems.

## 2022-08-02 NOTE — Assessment & Plan Note (Signed)
Waxing and waning pulmonary nodular disease, likely related to her rheumatoid.  She is clinically stable, has not had any decline in functional capacity, no new respiratory symptoms.  I think we can defer any repeat chest imaging given clinical stability and stability of her imaging over the last several years.

## 2022-08-08 ENCOUNTER — Ambulatory Visit (INDEPENDENT_AMBULATORY_CARE_PROVIDER_SITE_OTHER): Payer: Medicare Other

## 2022-08-08 DIAGNOSIS — J309 Allergic rhinitis, unspecified: Secondary | ICD-10-CM

## 2022-08-16 ENCOUNTER — Ambulatory Visit (INDEPENDENT_AMBULATORY_CARE_PROVIDER_SITE_OTHER): Payer: Medicare Other

## 2022-08-16 DIAGNOSIS — J309 Allergic rhinitis, unspecified: Secondary | ICD-10-CM | POA: Diagnosis not present

## 2022-08-22 ENCOUNTER — Ambulatory Visit (INDEPENDENT_AMBULATORY_CARE_PROVIDER_SITE_OTHER): Payer: Medicare Other

## 2022-08-22 ENCOUNTER — Ambulatory Visit: Payer: Medicare Other | Admitting: Allergy and Immunology

## 2022-08-22 DIAGNOSIS — J309 Allergic rhinitis, unspecified: Secondary | ICD-10-CM | POA: Diagnosis not present

## 2022-08-28 ENCOUNTER — Emergency Department (HOSPITAL_COMMUNITY)
Admission: EM | Admit: 2022-08-28 | Discharge: 2022-08-28 | Disposition: A | Discharge status: Home / Self Care | Payer: Medicare Other | Attending: Emergency Medicine | Admitting: Emergency Medicine

## 2022-08-28 ENCOUNTER — Other Ambulatory Visit: Payer: Self-pay

## 2022-08-28 ENCOUNTER — Encounter (HOSPITAL_COMMUNITY): Payer: Self-pay

## 2022-08-28 DIAGNOSIS — R1013 Epigastric pain: Secondary | ICD-10-CM | POA: Diagnosis present

## 2022-08-28 DIAGNOSIS — J45909 Unspecified asthma, uncomplicated: Secondary | ICD-10-CM | POA: Insufficient documentation

## 2022-08-28 LAB — COMPREHENSIVE METABOLIC PANEL
ALT: 52 U/L — ABNORMAL HIGH (ref 0–44)
AST: 45 U/L — ABNORMAL HIGH (ref 15–41)
Albumin: 4.5 g/dL (ref 3.5–5.0)
Alkaline Phosphatase: 41 U/L (ref 38–126)
Anion gap: 8 (ref 5–15)
BUN: 16 mg/dL (ref 8–23)
CO2: 26 mmol/L (ref 22–32)
Calcium: 8.9 mg/dL (ref 8.9–10.3)
Chloride: 100 mmol/L (ref 98–111)
Creatinine, Ser: 0.71 mg/dL (ref 0.44–1.00)
GFR, Estimated: >60 mL/min (ref >60)
Glucose, Bld: 108 mg/dL — ABNORMAL HIGH (ref 70–99)
Potassium: 3.9 mmol/L (ref 3.5–5.1)
Sodium: 134 mmol/L — ABNORMAL LOW (ref 135–145)
Total Bilirubin: 0.7 mg/dL (ref 0.3–1.2)
Total Protein: 7.5 g/dL (ref 6.5–8.1)

## 2022-08-28 LAB — CBC
HCT: 48.1 % — ABNORMAL HIGH (ref 36.0–46.0)
Hemoglobin: 16.1 g/dL — ABNORMAL HIGH (ref 12.0–15.0)
MCH: 30.6 pg (ref 26.0–34.0)
MCHC: 33.5 g/dL (ref 30.0–36.0)
MCV: 91.4 fL (ref 80.0–100.0)
Platelets: 323 10*3/uL (ref 150–400)
RBC: 5.26 MIL/uL — ABNORMAL HIGH (ref 3.87–5.11)
RDW: 12.6 % (ref 11.5–15.5)
WBC: 5.8 10*3/uL (ref 4.0–10.5)
nRBC: 0 % (ref 0.0–0.2)

## 2022-08-28 LAB — URINALYSIS, ROUTINE W REFLEX MICROSCOPIC
Bilirubin Urine: NEGATIVE
Glucose, UA: NEGATIVE mg/dL
Hgb urine dipstick: NEGATIVE
Ketones, ur: NEGATIVE mg/dL
Leukocytes,Ua: NEGATIVE
Nitrite: NEGATIVE
Protein, ur: NEGATIVE mg/dL
Specific Gravity, Urine: <1.005 — ABNORMAL LOW (ref 1.005–1.030)
pH: 6.5 (ref 5.0–8.0)

## 2022-08-28 LAB — LIPASE, BLOOD: Lipase: 38 U/L (ref 11–51)

## 2022-08-28 MED ORDER — SUCRALFATE 1 G PO TABS: 1.0000 g | ORAL_TABLET | Freq: Three times a day (TID) | ORAL | 0 refills | Status: AC

## 2022-08-28 NOTE — ED Triage Notes (Addendum)
Patient c/o mid abdominal pain and nausea x 4 days. Patient states pain is worse after eating.Patient states she feels "like I need to burp and can't."

## 2022-08-29 NOTE — ED Provider Notes (Signed)
[facility] DEPT Provider Note   CSN: [id number] Arrival date & time: 08/28/22  1250     History  Chief Complaint  Patient presents with   Abdominal Pain   Nausea    April Waters is a 71 y.o. female.   Abdominal Pain Patient abdominal pain.  Upper abdomen.  Has had for around 4 days.  Has had nausea.  Decreased oral intake.  Has had burping.  Has had a history of pain like this in the past.  Has seen GI for it.  Reviewed GI notes and mentioned her chronic epigastric abdominal pain.  No fevers.  No diarrhea.    Past Medical History:  Diagnosis Date   Allergy    Asthma    C. difficile enteritis    Chicken pox    Common migraine with intractable migraine 05/26/2015   [facility] Patient infection 1986   lasted about a year late diagnosis   Measles    Osteoporosis    Rheumatoid arthritis ([place])     Home Medications Prior to Admission medications   Medication Sig Start Date End Date Taking? Authorizing Provider  sucralfate (CARAFATE) 1 g tablet Take 1 tablet (1 g total) by mouth 4 (four) times daily -  with meals and at bedtime for 10 days. 08/28/22 09/07/22 Yes Davonna Belling, MD  acetaminophen (TYLENOL) 500 MG tablet Take 500 mg by mouth every 6 (six) hours as needed. Reported on 11/29/2015    [provider]  ascorbic acid (VITAMIN C) 500 MG tablet Take 500 mg by mouth daily.    [provider]  Cyanocobalamin (B-12 PO) Take by mouth.    [provider]  EPINEPHrine (EPIPEN 2-PAK) 0.3 mg/0.3 mL IJ SOAJ injection Inject 0.3 mg into the muscle as needed for anaphylaxis. 06/27/22   Kozlow, Donnamarie Poag, MD  EPINEPHrine 0.3 mg/0.3 mL IJ SOAJ injection Inject 3 mg into the skin as needed. 02/21/22   Kozlow, Donnamarie Poag, MD  famotidine (PEPCID) 20 MG tablet Take 1 tablet (20 mg total) by mouth daily. 04/11/22   Kozlow, Donnamarie Poag, MD  levalbuterol [facility] HFA) 45 MCG/ACT inhaler Inhale 2 puffs into the lungs every 6 (six) hours as needed for  wheezing. 02/21/22   Kozlow, Donnamarie Poag, MD  loratadine (CLARITIN) 10 MG tablet Take 1 tablet (10 mg total) by mouth daily. 04/11/22   Kozlow, Donnamarie Poag, MD  Magnesium 300 MG CAPS Take 1 capsule by mouth daily.    [provider]  Olopatadine HCl (PATADAY) 0.2 % SOLN Apply 1 drop to eye daily as needed. 02/21/22   Kozlow, Donnamarie Poag, MD  peppermint oil liquid     [provider]  Propylene Glycol (SYSTANE COMPLETE) 0.6 % SOLN Apply 1 drop to eye in the morning, at noon, in the evening, and at bedtime. 02/21/22   Kozlow, Donnamarie Poag, MD  Riboflavin (B2 PO) Take by mouth.    [provider]  VITAMIN K PO Take by mouth.    [provider]  albuterol (PROAIR HFA) 108 (90 Base) MCG/ACT inhaler Inhale 2 puffs into the lungs every 4 (four) hours as needed for wheezing or shortness of breath. 12/15/15 12/15/15  [name], MD      Allergies    Cardizem [diltiazem hcl], Codeine, Penicillins, Vicodin [hydrocodone-acetaminophen], and Yeast    Review of Systems   Review of Systems  Gastrointestinal:  Positive for abdominal pain.    Physical Exam Updated Vital Signs BP 134/72  Pulse 69   Temp 97.8 F (36.6 C) (Oral)   Resp 18   Ht '5\' 2"'$  (1.575 m)   Wt 37.5 kg   SpO2 100%   BMI 15.13 kg/m  Physical Exam Vitals and nursing note reviewed.  Cardiovascular:     Rate and Rhythm: Normal rate.  Abdominal:     Comments: Epigastric tenderness no rebound or guarding.  No hernias palpated.  Neurological:     Mental Status: She is alert.     ED Results / Procedures / Treatments   Labs (all labs ordered are listed, but only abnormal results are displayed) Labs Reviewed  COMPREHENSIVE METABOLIC PANEL - Abnormal; Notable for the following components:      Result Value   Sodium 134 (*)    Glucose, Bld 108 (*)    AST 45 (*)    ALT 52 (*)    All other components within normal limits  CBC - Abnormal; Notable for the following components:   RBC 5.26 (*)    Hemoglobin 16.1 (*)     HCT 48.1 (*)    All other components within normal limits  URINALYSIS, ROUTINE W REFLEX MICROSCOPIC - Abnormal; Notable for the following components:   Specific Gravity, Urine <1.005 (*)    All other components within normal limits  LIPASE, BLOOD    EKG None  Radiology No results found.  Procedures Procedures    Medications Ordered in ED Medications - No data to display  ED Course/ Medical Decision Making/ A&P                           Medical Decision Making Amount and/or Complexity of Data Reviewed Labs: ordered.  Risk Prescription drug management.   Patient with upper abdominal pain.  Somewhat acute on chronic.  Differential diagnosis includes biliary disease, colitis, enteritis, nonspecific abdominal pain.  Reviewed records and has had CTs for the same in the past.  LFTs just minimally elevated.  Reviewed GI note.  Discussed with patient about further imaging at this time but we feel GI follow-up is reasonable.  Had planned for upper endoscopy if pain continued.  Appears stable for discharge home.  Will treat symptomatically with Carafate.        Final Clinical Impression(s) / ED Diagnoses Final diagnoses:  Epigastric pain    Rx / DC Orders ED Discharge Orders          Ordered    sucralfate (CARAFATE) 1 g tablet  3 times daily with meals & bedtime        08/28/22 1524              Davonna Belling, MD 08/29/22 [phone]

## 2022-08-30 ENCOUNTER — Ambulatory Visit (INDEPENDENT_AMBULATORY_CARE_PROVIDER_SITE_OTHER): Payer: Medicare Other

## 2022-08-30 DIAGNOSIS — J309 Allergic rhinitis, unspecified: Secondary | ICD-10-CM | POA: Diagnosis not present

## 2022-09-05 ENCOUNTER — Ambulatory Visit (INDEPENDENT_AMBULATORY_CARE_PROVIDER_SITE_OTHER): Payer: Medicare Other

## 2022-09-05 DIAGNOSIS — J309 Allergic rhinitis, unspecified: Secondary | ICD-10-CM | POA: Diagnosis not present

## 2022-09-13 ENCOUNTER — Ambulatory Visit (INDEPENDENT_AMBULATORY_CARE_PROVIDER_SITE_OTHER): Payer: Medicare Other

## 2022-09-13 DIAGNOSIS — J309 Allergic rhinitis, unspecified: Secondary | ICD-10-CM

## 2022-09-20 ENCOUNTER — Ambulatory Visit (INDEPENDENT_AMBULATORY_CARE_PROVIDER_SITE_OTHER): Payer: Medicare Other

## 2022-09-20 DIAGNOSIS — J309 Allergic rhinitis, unspecified: Secondary | ICD-10-CM

## 2022-09-21 ENCOUNTER — Other Ambulatory Visit: Payer: Self-pay | Admitting: Family Medicine

## 2022-09-21 ENCOUNTER — Ambulatory Visit
Admission: RE | Admit: 2022-09-21 | Discharge: 2022-09-21 | Disposition: A | Discharge status: Home / Self Care | Payer: Medicare Other | Source: Ambulatory Visit | Attending: Family Medicine | Admitting: Family Medicine

## 2022-09-21 DIAGNOSIS — Z1231 Encounter for screening mammogram for malignant neoplasm of breast: Secondary | ICD-10-CM

## 2022-09-26 ENCOUNTER — Encounter: Payer: Self-pay | Admitting: Allergy and Immunology

## 2022-09-26 ENCOUNTER — Ambulatory Visit (INDEPENDENT_AMBULATORY_CARE_PROVIDER_SITE_OTHER): Payer: Medicare Other | Admitting: Allergy and Immunology

## 2022-09-26 ENCOUNTER — Other Ambulatory Visit: Payer: Self-pay

## 2022-09-26 ENCOUNTER — Ambulatory Visit: Payer: Self-pay

## 2022-09-26 VITALS — BP 116/68 | HR 80 | Temp 98.5°F | Resp 16 | Ht 62.0 in | Wt 89.9 lb

## 2022-09-26 DIAGNOSIS — T781XXD Other adverse food reactions, not elsewhere classified, subsequent encounter: Secondary | ICD-10-CM

## 2022-09-26 DIAGNOSIS — J4520 Mild intermittent asthma, uncomplicated: Secondary | ICD-10-CM

## 2022-09-26 DIAGNOSIS — J3089 Other allergic rhinitis: Secondary | ICD-10-CM

## 2022-09-26 DIAGNOSIS — J301 Allergic rhinitis due to pollen: Secondary | ICD-10-CM

## 2022-09-26 DIAGNOSIS — J309 Allergic rhinitis, unspecified: Secondary | ICD-10-CM

## 2022-09-26 DIAGNOSIS — T7800XA Anaphylactic reaction due to unspecified food, initial encounter: Secondary | ICD-10-CM

## 2022-09-26 MED ORDER — LEVALBUTEROL TARTRATE 45 MCG/ACT IN AERO: 2.0000 | INHALATION_SPRAY | Freq: Four times a day (QID) | RESPIRATORY_TRACT | 1 refills | Status: DC | PRN

## 2022-09-26 MED ORDER — FAMOTIDINE 20 MG PO TABS: 20.0000 mg | ORAL_TABLET | Freq: Every day | ORAL | 1 refills | Status: DC

## 2022-09-26 NOTE — Patient Instructions (Addendum)
  1.  Continue immunotherapy with Green 0.5 as maximal dose  2. Use the following on morning of immunotherapy:   A. Fexofenadine 180 mg - 1 tablet  B. Famotidine 20 mg - 1 tablet  2.  If needed:   A. Xopenx HFA - 2 inhalations every 4-6 hours  B. OTC antihistamine - claritin / zyrtec 10 mg - 1 tablet 1 time per day  C. Epi-pen, benadryl, MD/ER evalution for allergic reaction.  D. Pataday -1 drop each eye 1 time per day   E. Systane -multiple times per day  3. Return to clinic in 12 months or earlier if problem.

## 2022-09-26 NOTE — Progress Notes (Unsigned)
[facility]   Follow-up Note  Referring Provider: Maury Dus, MD Primary Provider: Antony Contras, MD Date of Office Visit: 09/26/2022  Subjective:   April Waters (DOB: 05/30/1951) is a 72 y.o. female who returns to the [facility] on 09/26/2022 in re-evaluation of the following:  HPI: Lelan Pons returns to this clinic in evaluation of allergic rhinoconjunctivitis, asthma, oral allergy syndrome, and adverse food reaction to banana and sweet potato.  I last saw her in this clinic 27 April 2022.  She was having a fair amount of problems with her immunotherapy and we ended up making her maintenance dose 0.5 ml green vial and she uses a combination of an H1 and H2 receptor blocker prior to administration of immunotherapy.  As a result of this plan she has really done very well and has not had any adverse reaction secondary to the administration of immunotherapy.  She has also changed her diet significantly.  She has eliminated most vegetables and fruits and her abdomen is doing much better at this point in time.  She eats dairy and she eats chicken and then she has a few vegetables and fruits.  She remains away from sweet potato and banana.  She has had very little issues with her airway and has no need to use a short acting bronchodilator and has not required a systemic steroid or an antibiotic for any type of airway issue.  She has received the flu vaccine, RSV vaccine, and COVID-vaccine.  Allergies as of 09/26/2022       Reactions   Cardizem [diltiazem Hcl]    Codeine    Penicillins    Vicodin [hydrocodone-acetaminophen] Other (See Comments)   Muscle Weakness   Yeast Other (See Comments)        Medication List    acetaminophen 500 MG tablet Commonly known as: TYLENOL Take 500 mg by mouth every 6 (six) hours as needed. Reported on 11/29/2015   ascorbic acid 500 MG tablet Commonly known as: VITAMIN C Take 500 mg by  mouth daily.   B-12 PO Take by mouth.   B2 PO Take by mouth.   EPINEPHrine 0.3 mg/0.3 mL Soaj injection Commonly known as: EPI-PEN Inject 3 mg into the skin as needed.   EPINEPHrine 0.3 mg/0.3 mL Soaj injection Commonly known as: EpiPen 2-Pak Inject 0.3 mg into the muscle as needed for anaphylaxis.   famotidine 20 MG tablet Commonly known as: Pepcid Take 1 tablet (20 mg total) by mouth daily.   levalbuterol 45 MCG/ACT inhaler Commonly known as: XOPENEX HFA Inhale 2 puffs into the lungs every 6 (six) hours as needed for wheezing.   loratadine 10 MG tablet Commonly known as: CLARITIN Take 1 tablet (10 mg total) by mouth daily.   Magnesium 300 MG Caps Take 1 capsule by mouth daily.   Olopatadine HCl 0.2 % Soln Commonly known as: Pataday Apply 1 drop to eye daily as needed.   peppermint oil liquid   sucralfate 1 g tablet Commonly known as: CARAFATE Take 1 tablet (1 g total) by mouth 4 (four) times daily -  with meals and at bedtime for 10 days.   Systane Complete 0.6 % Soln Generic drug: Propylene Glycol Apply 1 drop to eye in the morning, at noon, in the evening, and at bedtime.   VITAMIN K PO Take by mouth.    Past Medical History:  Diagnosis Date   Allergy    Asthma  C. difficile enteritis    Chicken pox    Common migraine with intractable migraine 05/26/2015   [facility] Patient infection 1986   lasted about a year late diagnosis   Measles    Osteoporosis    Rheumatoid arthritis ([place])     Past Surgical History:  Procedure Laterality Date   ANTERIOR CRUCIATE LIGAMENT REPAIR  09/04/1996   right   BREAST CYST EXCISION Right    when she was 15   cebaceous cyst     right breast - age 89   fluid knee  09/04/1992   left   HERNIA REPAIR     bilateral inguinal 2 months   [place]   KNEE SURGERY  09/04/1988   medial meniscous right   LAPAROSCOPY  09/05/1991   endometriosis   nasal polyps     age 46    Review of systems negative  except as noted in HPI / PMHx or noted below:  Review of Systems  Constitutional: Negative.   HENT: Negative.    Eyes: Negative.   Respiratory: Negative.    Cardiovascular: Negative.   Gastrointestinal: Negative.   Genitourinary: Negative.   Musculoskeletal: Negative.   Skin: Negative.   Neurological: Negative.   Endo/Heme/Allergies: Negative.   Psychiatric/Behavioral: Negative.       Objective:   Vitals:   09/26/22 1451  BP: 116/68  Pulse: 80  Resp: 16  Temp: 98.5 F (36.9 C)  SpO2: 98%   Height: '5\' 2"'$  (157.5 cm)  Weight: 89 lb 14.4 oz (40.8 kg)   Physical Exam Constitutional:      Appearance: She is not diaphoretic.  HENT:     Head: Normocephalic.     Right Ear: Tympanic membrane, ear canal and external ear normal.     Left Ear: Tympanic membrane, ear canal and external ear normal.     Nose: Nose normal. No mucosal edema or rhinorrhea.     Mouth/Throat:     Pharynx: Uvula midline. No oropharyngeal exudate.  Eyes:     Conjunctiva/sclera: Conjunctivae normal.  Neck:     Thyroid: No thyromegaly.     Trachea: Trachea normal. No tracheal tenderness or tracheal deviation.  Cardiovascular:     Rate and Rhythm: Normal rate and regular rhythm.     Heart sounds: Normal heart sounds, S1 normal and S2 normal. No murmur heard. Pulmonary:     Effort: No respiratory distress.     Breath sounds: Normal breath sounds. No stridor. No wheezing or rales.  Lymphadenopathy:     Head:     Right side of head: No tonsillar adenopathy.     Left side of head: No tonsillar adenopathy.     Cervical: No cervical adenopathy.  Skin:    Findings: No erythema or rash.     Nails: There is no clubbing.  Neurological:     Mental Status: She is alert.     Diagnostics: The patient had an Asthma Control Test with the following results: ACT Total Score: 25.    Assessment and Plan:   1. Perennial allergic rhinitis   2. Seasonal allergic rhinitis due to pollen   3. Asthma, mild  intermittent, well-controlled   4. Pollen-food allergy, subsequent encounter   5. Allergy with anaphylaxis due to food     1.  Continue immunotherapy with Green 0.5 as maximal dose  2. Use the following on morning of immunotherapy:   A. Fexofenadine 180 mg - 1 tablet  B.  Famotidine 20 mg - 1 tablet  2.  If needed:   A. Xopenx HFA - 2 inhalations every 4-6 hours  B. OTC antihistamine - claritin / zyrtec 10 mg - 1 tablet 1 time per day  C. Epi-pen, benadryl, MD/ER evalution for allergic reaction.  D. Pataday -1 drop each eye 1 time per day   E. Systane -multiple times per day  3. Return to clinic in 12 months or earlier if problem.   Malala appears to be doing quite well on her current plan and she will continue on immunotherapy and she has a selection of other agents to utilize should they be required as noted above.  I will see her back in this clinic in 1 year or earlier if there is a problem.  Allena Katz, MD Allergy / Immunology [place]

## 2022-09-27 ENCOUNTER — Encounter: Payer: Self-pay | Admitting: Allergy and Immunology

## 2022-09-28 ENCOUNTER — Telehealth: Payer: Self-pay

## 2022-09-28 NOTE — Telephone Encounter (Signed)
PA request received via CMM for Levalbuterol Tartrate 45MCG/ACT aerosol  PA not submitted due to Albuterol HFA being preferred and no notation of failure to this medication.   Key: [id number]

## 2022-09-28 NOTE — Telephone Encounter (Signed)
I called the patient to see if she had any issues with using the albuterol inhaler. Patient does not think the increase heart rate was due to her albuterol the last time she had to use it. PA team was not able to find documented proof for Xopenex PA to be approved.

## 2022-10-02 ENCOUNTER — Ambulatory Visit (INDEPENDENT_AMBULATORY_CARE_PROVIDER_SITE_OTHER): Payer: Medicare Other

## 2022-10-02 DIAGNOSIS — J309 Allergic rhinitis, unspecified: Secondary | ICD-10-CM | POA: Diagnosis not present

## 2022-10-10 ENCOUNTER — Ambulatory Visit (INDEPENDENT_AMBULATORY_CARE_PROVIDER_SITE_OTHER): Payer: Medicare Other

## 2022-10-10 DIAGNOSIS — J309 Allergic rhinitis, unspecified: Secondary | ICD-10-CM

## 2022-10-17 ENCOUNTER — Ambulatory Visit: Payer: Medicare Other | Admitting: Allergy and Immunology

## 2022-10-17 ENCOUNTER — Ambulatory Visit (INDEPENDENT_AMBULATORY_CARE_PROVIDER_SITE_OTHER): Payer: Medicare Other

## 2022-10-17 DIAGNOSIS — J309 Allergic rhinitis, unspecified: Secondary | ICD-10-CM

## 2022-10-24 ENCOUNTER — Ambulatory Visit (INDEPENDENT_AMBULATORY_CARE_PROVIDER_SITE_OTHER): Payer: Medicare Other

## 2022-10-24 DIAGNOSIS — J309 Allergic rhinitis, unspecified: Secondary | ICD-10-CM

## 2022-11-01 ENCOUNTER — Ambulatory Visit (INDEPENDENT_AMBULATORY_CARE_PROVIDER_SITE_OTHER): Payer: Medicare Other

## 2022-11-01 DIAGNOSIS — J309 Allergic rhinitis, unspecified: Secondary | ICD-10-CM

## 2022-11-06 ENCOUNTER — Ambulatory Visit (INDEPENDENT_AMBULATORY_CARE_PROVIDER_SITE_OTHER): Payer: Medicare Other | Admitting: *Deleted

## 2022-11-06 DIAGNOSIS — J309 Allergic rhinitis, unspecified: Secondary | ICD-10-CM | POA: Diagnosis not present

## 2022-11-08 ENCOUNTER — Ambulatory Visit: Payer: Medicare Other | Admitting: Podiatry

## 2022-11-14 ENCOUNTER — Ambulatory Visit (INDEPENDENT_AMBULATORY_CARE_PROVIDER_SITE_OTHER): Payer: Medicare Other

## 2022-11-14 DIAGNOSIS — J309 Allergic rhinitis, unspecified: Secondary | ICD-10-CM

## 2022-11-15 ENCOUNTER — Ambulatory Visit: Payer: Medicare Other | Admitting: Podiatry

## 2022-11-21 ENCOUNTER — Ambulatory Visit: Payer: Medicare Other | Admitting: Podiatry

## 2022-11-21 ENCOUNTER — Ambulatory Visit (INDEPENDENT_AMBULATORY_CARE_PROVIDER_SITE_OTHER): Payer: Medicare Other

## 2022-11-21 DIAGNOSIS — J309 Allergic rhinitis, unspecified: Secondary | ICD-10-CM

## 2022-11-29 ENCOUNTER — Ambulatory Visit (INDEPENDENT_AMBULATORY_CARE_PROVIDER_SITE_OTHER): Payer: Medicare Other

## 2022-11-29 DIAGNOSIS — J309 Allergic rhinitis, unspecified: Secondary | ICD-10-CM

## 2022-12-06 ENCOUNTER — Ambulatory Visit (INDEPENDENT_AMBULATORY_CARE_PROVIDER_SITE_OTHER): Payer: Medicare Other

## 2022-12-06 DIAGNOSIS — J309 Allergic rhinitis, unspecified: Secondary | ICD-10-CM | POA: Diagnosis not present

## 2022-12-11 ENCOUNTER — Ambulatory Visit (INDEPENDENT_AMBULATORY_CARE_PROVIDER_SITE_OTHER): Payer: Medicare Other | Admitting: *Deleted

## 2022-12-11 DIAGNOSIS — J309 Allergic rhinitis, unspecified: Secondary | ICD-10-CM

## 2022-12-19 ENCOUNTER — Ambulatory Visit (INDEPENDENT_AMBULATORY_CARE_PROVIDER_SITE_OTHER): Payer: Medicare Other | Admitting: Podiatry

## 2022-12-19 ENCOUNTER — Ambulatory Visit (INDEPENDENT_AMBULATORY_CARE_PROVIDER_SITE_OTHER): Payer: Medicare Other

## 2022-12-19 DIAGNOSIS — B351 Tinea unguium: Secondary | ICD-10-CM | POA: Diagnosis not present

## 2022-12-19 DIAGNOSIS — M79675 Pain in left toe(s): Secondary | ICD-10-CM | POA: Diagnosis not present

## 2022-12-19 DIAGNOSIS — M79674 Pain in right toe(s): Secondary | ICD-10-CM | POA: Diagnosis not present

## 2022-12-19 DIAGNOSIS — L84 Corns and callosities: Secondary | ICD-10-CM | POA: Diagnosis not present

## 2022-12-19 DIAGNOSIS — M069 Rheumatoid arthritis, unspecified: Secondary | ICD-10-CM | POA: Diagnosis not present

## 2022-12-19 DIAGNOSIS — I7300 Raynaud's syndrome without gangrene: Secondary | ICD-10-CM

## 2022-12-19 DIAGNOSIS — J309 Allergic rhinitis, unspecified: Secondary | ICD-10-CM | POA: Diagnosis not present

## 2022-12-19 NOTE — Progress Notes (Signed)
VIALS EXP 12-19-23 

## 2022-12-19 NOTE — Progress Notes (Signed)
  Subjective:  Patient ID: April Waters, female    DOB: 07-Jan-1951,  MRN: [id number]  Chief Complaint  Patient presents with   Callouses    left foot little toe calluses causing pain/ right foot middle of foot callus    72 y.o. female returns for follow-up with the above complaint. History confirmed with patient.  Callus is still a problem on both feet.  Nails are thickened elongated she is unable to cut them  Objective:  Physical Exam: no trophic changes or ulcerative lesions, normal DP and PT pulses and normal sensory exam.  Toes are cool to touch.  Thickened dystrophic elongated nails with subungual debris and dystrophy    Porokeratosis submetatarsal 5 on the left and submetatarsal 2 on the right Assessment:   1. Callus of foot   2. Raynaud's disease without gangrene   3. Rheumatoid arthritis, involving unspecified site, unspecified whether rheumatoid factor present   4. Pain due to onychomycosis of toenails of both feet       Plan:  Patient was evaluated and treated and all questions answered.  All symptomatic hyperkeratoses were safely debrided with a sterile #15 blade to patient's level of comfort without incident. We discussed preventative and palliative care of these lesions including supportive and accommodative shoegear, padding, prefabricated and custom molded accommodative orthoses, use of a pumice stone and lotions/creams daily.  Discussed the etiology and treatment options for the condition in detail with the patient.  Previous debridement of nail has been helpful. Recommended debridement of the nails today. Sharp and mechanical debridement performed of all painful and mycotic nails today. Nails debrided in length and thickness using a nail nipper to level of comfort. Discussed treatment options including appropriate shoe gear. Follow up as needed for painful nails.    Return if symptoms worsen or fail to improve.

## 2022-12-20 DIAGNOSIS — J301 Allergic rhinitis due to pollen: Secondary | ICD-10-CM | POA: Diagnosis not present

## 2022-12-21 DIAGNOSIS — J302 Other seasonal allergic rhinitis: Secondary | ICD-10-CM | POA: Diagnosis not present

## 2022-12-25 ENCOUNTER — Ambulatory Visit (INDEPENDENT_AMBULATORY_CARE_PROVIDER_SITE_OTHER): Payer: Medicare Other

## 2022-12-25 DIAGNOSIS — J309 Allergic rhinitis, unspecified: Secondary | ICD-10-CM

## 2023-01-04 ENCOUNTER — Ambulatory Visit (INDEPENDENT_AMBULATORY_CARE_PROVIDER_SITE_OTHER): Payer: Medicare Other

## 2023-01-04 DIAGNOSIS — J309 Allergic rhinitis, unspecified: Secondary | ICD-10-CM

## 2023-01-08 ENCOUNTER — Ambulatory Visit (INDEPENDENT_AMBULATORY_CARE_PROVIDER_SITE_OTHER): Payer: Medicare Other | Admitting: *Deleted

## 2023-01-08 DIAGNOSIS — J309 Allergic rhinitis, unspecified: Secondary | ICD-10-CM | POA: Diagnosis not present

## 2023-01-16 ENCOUNTER — Ambulatory Visit (INDEPENDENT_AMBULATORY_CARE_PROVIDER_SITE_OTHER): Payer: Medicare Other

## 2023-01-16 ENCOUNTER — Ambulatory Visit: Payer: Medicare Other | Admitting: Podiatry

## 2023-01-16 DIAGNOSIS — J309 Allergic rhinitis, unspecified: Secondary | ICD-10-CM | POA: Diagnosis not present

## 2023-01-22 ENCOUNTER — Ambulatory Visit (INDEPENDENT_AMBULATORY_CARE_PROVIDER_SITE_OTHER): Payer: Medicare Other | Admitting: *Deleted

## 2023-01-22 DIAGNOSIS — J309 Allergic rhinitis, unspecified: Secondary | ICD-10-CM | POA: Diagnosis not present

## 2023-01-30 ENCOUNTER — Ambulatory Visit (INDEPENDENT_AMBULATORY_CARE_PROVIDER_SITE_OTHER): Payer: Medicare Other | Admitting: *Deleted

## 2023-01-30 DIAGNOSIS — J309 Allergic rhinitis, unspecified: Secondary | ICD-10-CM

## 2023-02-05 ENCOUNTER — Telehealth: Payer: Self-pay | Admitting: *Deleted

## 2023-02-05 ENCOUNTER — Ambulatory Visit (INDEPENDENT_AMBULATORY_CARE_PROVIDER_SITE_OTHER): Payer: Medicare Other | Admitting: *Deleted

## 2023-02-05 DIAGNOSIS — J309 Allergic rhinitis, unspecified: Secondary | ICD-10-CM | POA: Diagnosis not present

## 2023-02-05 NOTE — Telephone Encounter (Signed)
Error

## 2023-02-13 ENCOUNTER — Ambulatory Visit (INDEPENDENT_AMBULATORY_CARE_PROVIDER_SITE_OTHER): Payer: Medicare Other

## 2023-02-13 DIAGNOSIS — J309 Allergic rhinitis, unspecified: Secondary | ICD-10-CM

## 2023-02-13 IMAGING — MG MM DIGITAL SCREENING BILAT W/ TOMO AND CAD
6 of 10 series · 6 of 30 positions shown · non-contrast
Comparison: Previous exam(s).

CLINICAL DATA: Screening.

EXAM:
DIGITAL SCREENING BILATERAL MAMMOGRAM WITH TOMOSYNTHESIS AND CAD
TECHNIQUE: Bilateral screening digital craniocaudal and mediolateral oblique
mammograms were obtained. Bilateral screening digital breast
tomosynthesis was performed. The images were evaluated with
computer-aided detection.

[L MLO synth-2D]
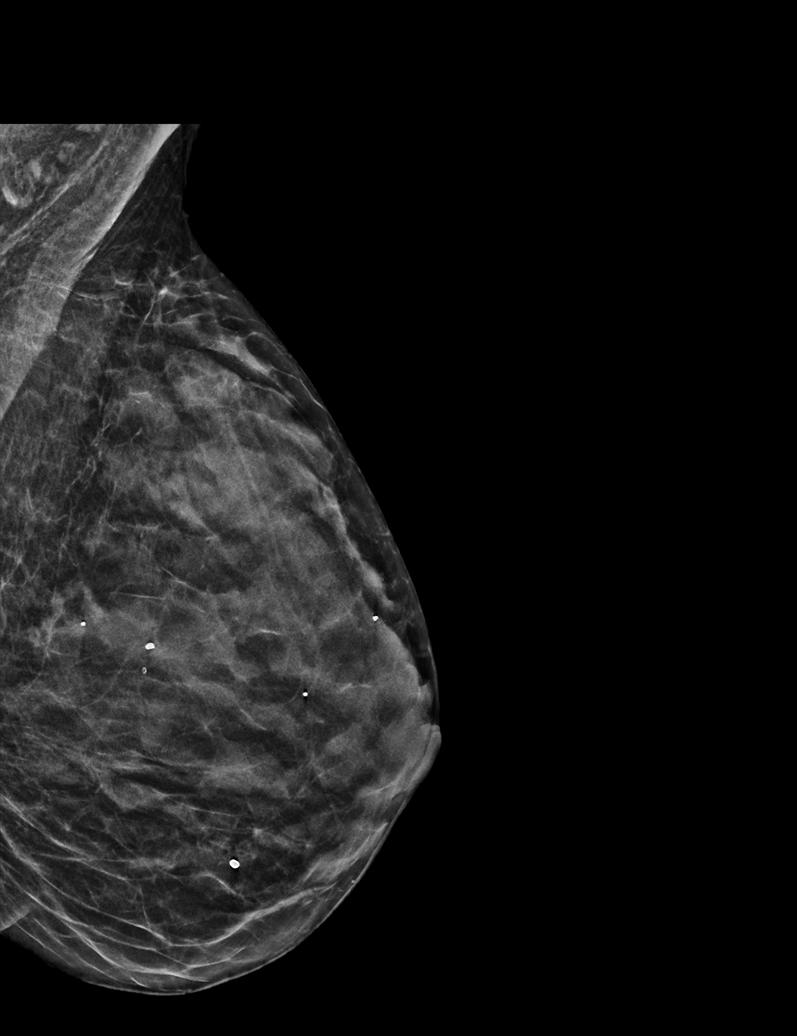

[R CC synth-2D]
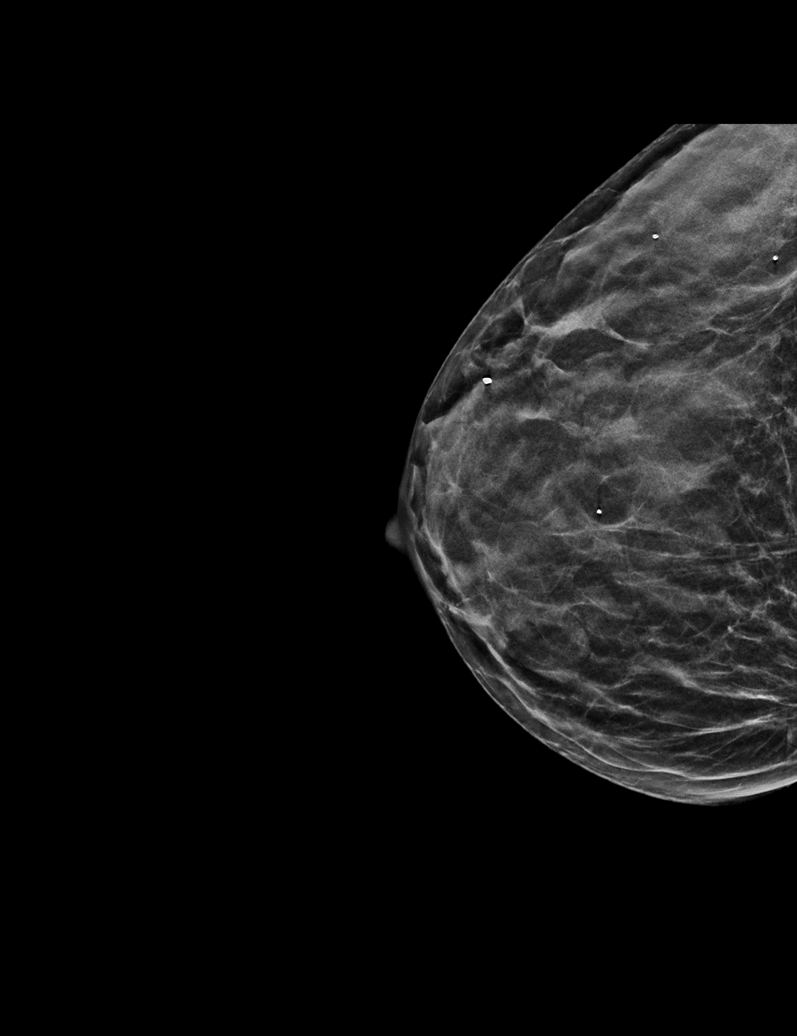

[L CC synth-2D]
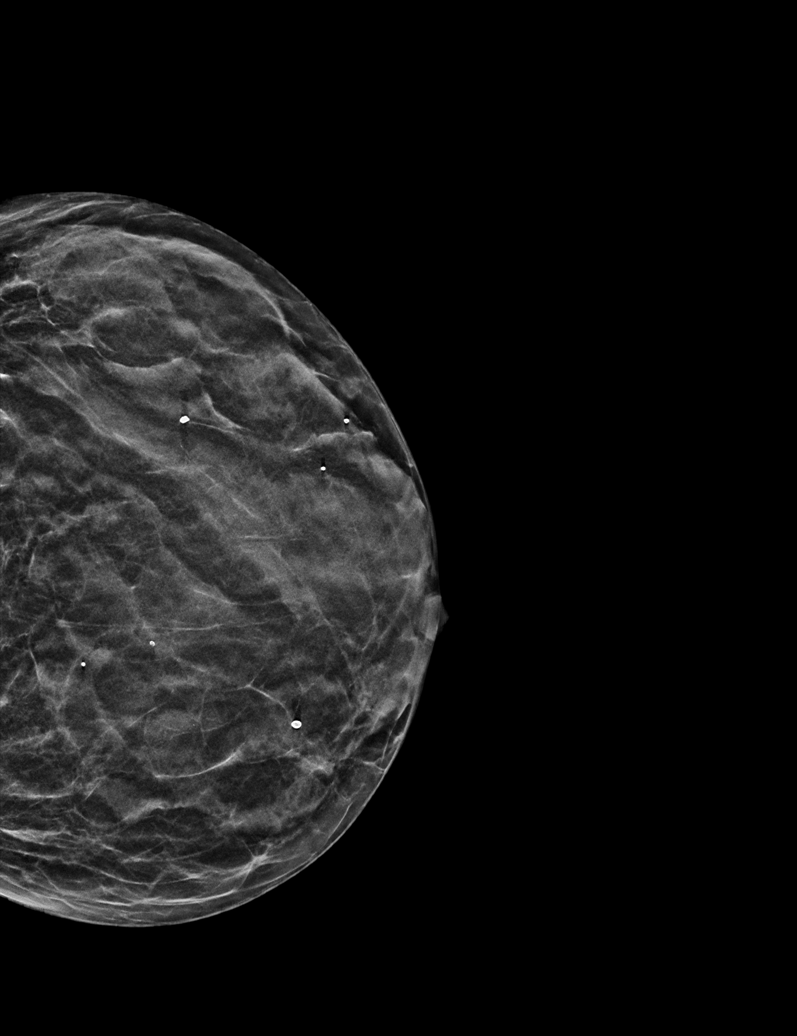

[R MLO synth-2D]
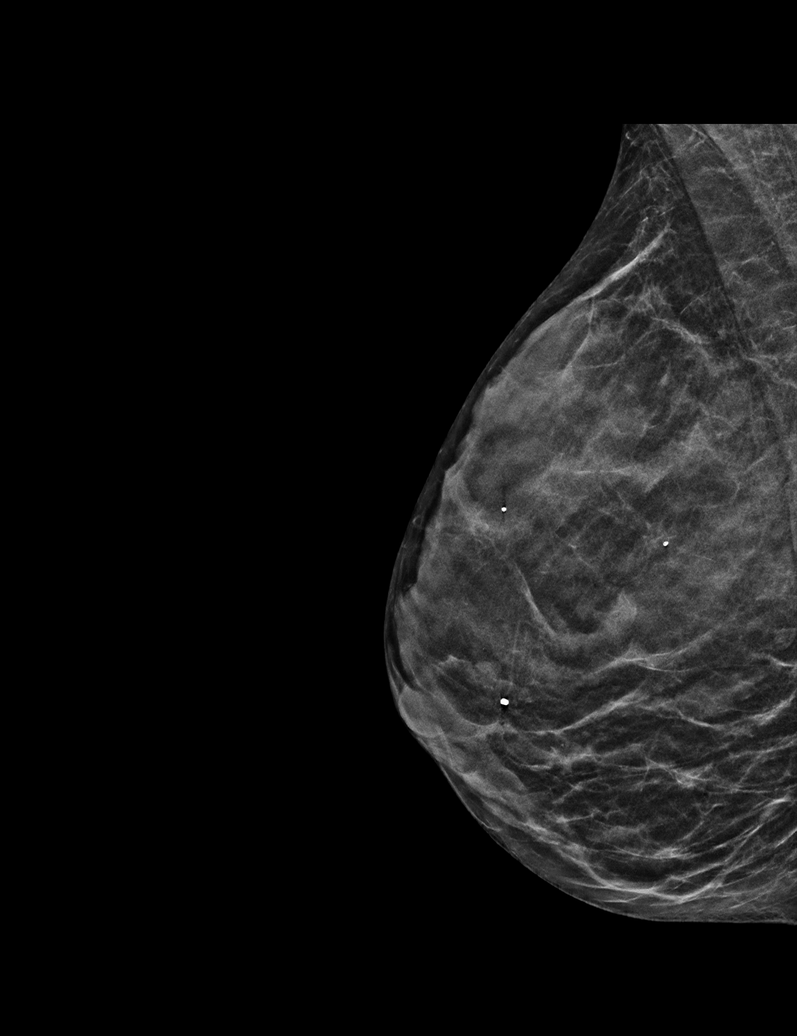

[R XCCL synth-2D]
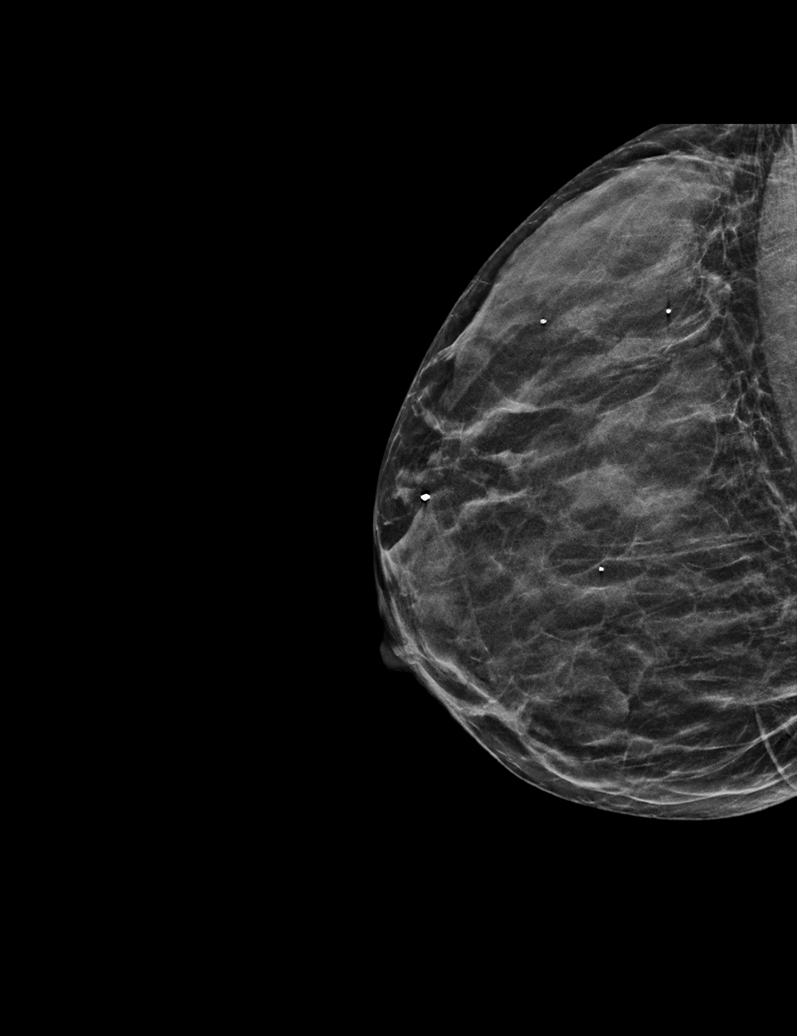

[R MLO tomo · tomo slice 17/33.0]
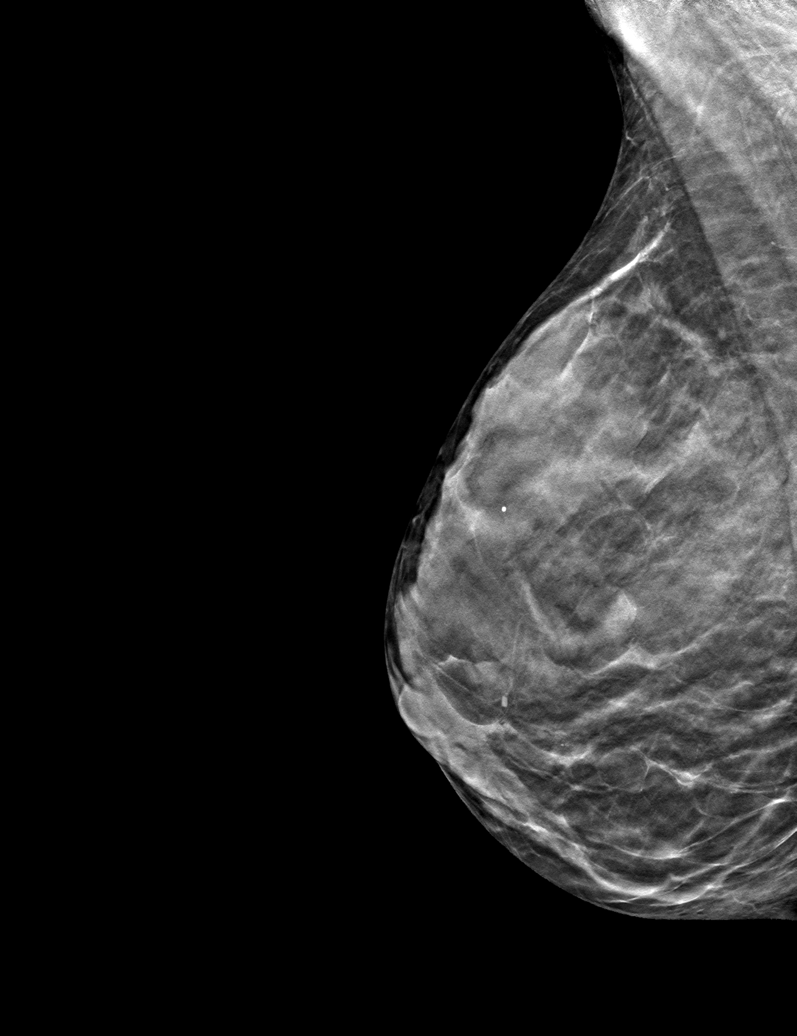

[6 of 30 positions shown; findings below may reference images not displayed]

ACR Breast Density Category d: The breast tissue is extremely dense,
which lowers the sensitivity of mammography.
FINDINGS: In the right breast, a possible mass warrants further evaluation. In
the left breast, no findings suspicious for malignancy.
IMPRESSION: Further evaluation is suggested for a possible mass in the right
breast.

RECOMMENDATION:
Diagnostic mammogram and possibly ultrasound of the right breast.
(Code:CF-9-ZZ1)

The patient will be contacted regarding the findings, and additional
imaging will be scheduled.

BI-RADS CATEGORY  0: Incomplete. Need additional imaging evaluation
and/or prior mammograms for comparison.

## 2023-02-27 ENCOUNTER — Ambulatory Visit (INDEPENDENT_AMBULATORY_CARE_PROVIDER_SITE_OTHER): Payer: Medicare Other | Admitting: *Deleted

## 2023-02-27 DIAGNOSIS — J309 Allergic rhinitis, unspecified: Secondary | ICD-10-CM

## 2023-02-28 DIAGNOSIS — A6000 Herpesviral infection of urogenital system, unspecified: Secondary | ICD-10-CM | POA: Insufficient documentation

## 2023-03-12 ENCOUNTER — Ambulatory Visit (INDEPENDENT_AMBULATORY_CARE_PROVIDER_SITE_OTHER): Payer: Medicare Other

## 2023-03-12 DIAGNOSIS — J309 Allergic rhinitis, unspecified: Secondary | ICD-10-CM

## 2023-03-20 ENCOUNTER — Ambulatory Visit (INDEPENDENT_AMBULATORY_CARE_PROVIDER_SITE_OTHER): Payer: Medicare Other

## 2023-03-20 DIAGNOSIS — J309 Allergic rhinitis, unspecified: Secondary | ICD-10-CM | POA: Diagnosis not present

## 2023-03-27 ENCOUNTER — Ambulatory Visit (INDEPENDENT_AMBULATORY_CARE_PROVIDER_SITE_OTHER): Payer: Medicare Other | Admitting: *Deleted

## 2023-03-27 DIAGNOSIS — J309 Allergic rhinitis, unspecified: Secondary | ICD-10-CM

## 2023-04-10 ENCOUNTER — Telehealth: Payer: Self-pay

## 2023-04-10 MED ORDER — ALBUTEROL SULFATE HFA 108 (90 BASE) MCG/ACT IN AERS: 2.0000 | INHALATION_SPRAY | RESPIRATORY_TRACT | 1 refills | Status: DC | PRN

## 2023-04-10 NOTE — Telephone Encounter (Signed)
Patient called and stated that she needed a refill on her albuterol as she is currently having a asthma flare. Will send in a refill for her albuterol. Patient states it is not bad however she did have some coughing while on the phone. Patient states that she will do a telephone visit tomorrow as she wants to see if things will subside after a while and after treatments with her inhaler. Patient is scheduled for a telephone visit with Nehemiah Settle tomorrow.

## 2023-04-11 ENCOUNTER — Telehealth: Payer: Medicare Other | Admitting: Family

## 2023-04-12 ENCOUNTER — Ambulatory Visit (INDEPENDENT_AMBULATORY_CARE_PROVIDER_SITE_OTHER): Payer: Medicare Other

## 2023-04-12 DIAGNOSIS — J309 Allergic rhinitis, unspecified: Secondary | ICD-10-CM | POA: Diagnosis not present

## 2023-04-23 ENCOUNTER — Ambulatory Visit (INDEPENDENT_AMBULATORY_CARE_PROVIDER_SITE_OTHER): Payer: Medicare Other | Admitting: *Deleted

## 2023-04-23 DIAGNOSIS — J309 Allergic rhinitis, unspecified: Secondary | ICD-10-CM

## 2023-05-08 ENCOUNTER — Ambulatory Visit (INDEPENDENT_AMBULATORY_CARE_PROVIDER_SITE_OTHER): Payer: Medicare Other | Admitting: *Deleted

## 2023-05-08 DIAGNOSIS — J309 Allergic rhinitis, unspecified: Secondary | ICD-10-CM | POA: Diagnosis not present

## 2023-05-22 ENCOUNTER — Ambulatory Visit (INDEPENDENT_AMBULATORY_CARE_PROVIDER_SITE_OTHER): Payer: Medicare Other

## 2023-05-22 DIAGNOSIS — J309 Allergic rhinitis, unspecified: Secondary | ICD-10-CM

## 2023-05-29 ENCOUNTER — Ambulatory Visit (INDEPENDENT_AMBULATORY_CARE_PROVIDER_SITE_OTHER): Payer: Self-pay

## 2023-05-29 DIAGNOSIS — J309 Allergic rhinitis, unspecified: Secondary | ICD-10-CM

## 2023-06-05 ENCOUNTER — Ambulatory Visit (INDEPENDENT_AMBULATORY_CARE_PROVIDER_SITE_OTHER): Payer: Medicare Other | Admitting: *Deleted

## 2023-06-05 DIAGNOSIS — J309 Allergic rhinitis, unspecified: Secondary | ICD-10-CM

## 2023-06-07 ENCOUNTER — Ambulatory Visit: Payer: Medicare Other | Admitting: Cardiology

## 2023-06-13 ENCOUNTER — Ambulatory Visit (INDEPENDENT_AMBULATORY_CARE_PROVIDER_SITE_OTHER): Payer: Self-pay | Admitting: *Deleted

## 2023-06-13 DIAGNOSIS — J309 Allergic rhinitis, unspecified: Secondary | ICD-10-CM

## 2023-06-14 ENCOUNTER — Encounter: Payer: Self-pay | Admitting: Podiatry

## 2023-06-14 ENCOUNTER — Ambulatory Visit (INDEPENDENT_AMBULATORY_CARE_PROVIDER_SITE_OTHER): Payer: Medicare Other | Admitting: Podiatry

## 2023-06-14 DIAGNOSIS — M79674 Pain in right toe(s): Secondary | ICD-10-CM

## 2023-06-14 DIAGNOSIS — M069 Rheumatoid arthritis, unspecified: Secondary | ICD-10-CM | POA: Diagnosis not present

## 2023-06-14 DIAGNOSIS — L84 Corns and callosities: Secondary | ICD-10-CM

## 2023-06-14 DIAGNOSIS — M79675 Pain in left toe(s): Secondary | ICD-10-CM

## 2023-06-14 DIAGNOSIS — B351 Tinea unguium: Secondary | ICD-10-CM

## 2023-06-14 DIAGNOSIS — I7300 Raynaud's syndrome without gangrene: Secondary | ICD-10-CM

## 2023-06-17 NOTE — Progress Notes (Signed)
  Subjective:  Patient ID: April Waters, female    DOB: 05-14-1951,  MRN: [id number]  Chief Complaint  Patient presents with   Callouses    Left 5th met head callus, right 2nd MPJ sulcus callus. Nail care    72 y.o. female returns for follow-up with the above complaint. History confirmed with patient.  Callus is still a problem on both feet.  Nails are thickened elongated she is unable to cut them.  Debridement of both issues previously has been very helpful in improving her function reducing her pain and allowing her to walk  Objective:  Physical Exam: no trophic changes or ulcerative lesions, normal DP and PT pulses and normal sensory exam.  Toes are cool to touch.  Thickened dystrophic elongated nails with subungual debris and dystrophy    Porokeratosis submetatarsal 5 on the left and submetatarsal 2 on the right Assessment:   1. Callus of foot   2. Raynaud's disease without gangrene   3. Rheumatoid arthritis, involving unspecified site, unspecified whether rheumatoid factor present (HCC)   4. Pain due to onychomycosis of toenails of both feet       Plan:  Patient was evaluated and treated and all questions answered.  All symptomatic hyperkeratoses were safely debrided with a sterile #15 blade to patient's level of comfort without incident. We discussed preventative and palliative care of these lesions including supportive and accommodative shoegear, padding, prefabricated and custom molded accommodative orthoses, use of a pumice stone and lotions/creams daily.  Discussed the etiology and treatment options for the condition in detail with the patient.  Previous debridement of nail has been helpful. Recommended debridement of the nails today. Sharp and mechanical debridement performed of all painful and mycotic nails today. Nails debrided in length and thickness using a nail nipper to level of comfort. Discussed treatment options including appropriate shoe gear. Follow up as needed  for painful nails.    Return if symptoms worsen or fail to improve.

## 2023-06-19 ENCOUNTER — Ambulatory Visit (INDEPENDENT_AMBULATORY_CARE_PROVIDER_SITE_OTHER): Payer: Self-pay | Admitting: *Deleted

## 2023-06-19 DIAGNOSIS — J309 Allergic rhinitis, unspecified: Secondary | ICD-10-CM

## 2023-07-02 ENCOUNTER — Ambulatory Visit (INDEPENDENT_AMBULATORY_CARE_PROVIDER_SITE_OTHER): Payer: Self-pay

## 2023-07-02 DIAGNOSIS — J309 Allergic rhinitis, unspecified: Secondary | ICD-10-CM | POA: Diagnosis not present

## 2023-07-04 ENCOUNTER — Ambulatory Visit: Payer: Medicare Other | Admitting: Gastroenterology

## 2023-07-05 ENCOUNTER — Other Ambulatory Visit: Payer: Self-pay | Admitting: Physician Assistant

## 2023-07-05 DIAGNOSIS — Z1231 Encounter for screening mammogram for malignant neoplasm of breast: Secondary | ICD-10-CM

## 2023-07-10 ENCOUNTER — Emergency Department (HOSPITAL_COMMUNITY)
Admission: EM | Admit: 2023-07-10 | Discharge: 2023-07-10 | Disposition: A | Discharge status: Home / Self Care | Payer: Medicare Other

## 2023-07-10 ENCOUNTER — Encounter (HOSPITAL_COMMUNITY): Payer: Self-pay

## 2023-07-10 ENCOUNTER — Emergency Department (HOSPITAL_COMMUNITY): Payer: Medicare Other

## 2023-07-10 DIAGNOSIS — R519 Headache, unspecified: Secondary | ICD-10-CM | POA: Diagnosis present

## 2023-07-10 DIAGNOSIS — Z79899 Other long term (current) drug therapy: Secondary | ICD-10-CM | POA: Insufficient documentation

## 2023-07-10 DIAGNOSIS — R42 Dizziness and giddiness: Secondary | ICD-10-CM | POA: Diagnosis not present

## 2023-07-10 DIAGNOSIS — J45909 Unspecified asthma, uncomplicated: Secondary | ICD-10-CM | POA: Insufficient documentation

## 2023-07-10 DIAGNOSIS — Z7951 Long term (current) use of inhaled steroids: Secondary | ICD-10-CM | POA: Insufficient documentation

## 2023-07-10 LAB — URINALYSIS, ROUTINE W REFLEX MICROSCOPIC
Bilirubin Urine: NEGATIVE
Glucose, UA: NEGATIVE mg/dL
Hgb urine dipstick: NEGATIVE
Ketones, ur: NEGATIVE mg/dL
Leukocytes,Ua: NEGATIVE
Nitrite: NEGATIVE
Protein, ur: NEGATIVE mg/dL
Specific Gravity, Urine: 1.005 (ref 1.005–1.030)
pH: 7 (ref 5.0–8.0)

## 2023-07-10 LAB — COMPREHENSIVE METABOLIC PANEL
ALT: 37 U/L (ref 0–44)
AST: 37 U/L (ref 15–41)
Albumin: 3.8 g/dL (ref 3.5–5.0)
Alkaline Phosphatase: 47 U/L (ref 38–126)
Anion gap: 10 (ref 5–15)
BUN: 13 mg/dL (ref 8–23)
CO2: 25 mmol/L (ref 22–32)
Calcium: 9.1 mg/dL (ref 8.9–10.3)
Chloride: 98 mmol/L (ref 98–111)
Creatinine, Ser: 0.59 mg/dL (ref 0.44–1.00)
GFR, Estimated: >60 mL/min (ref >60)
Glucose, Bld: 111 mg/dL — ABNORMAL HIGH (ref 70–99)
Potassium: 5 mmol/L (ref 3.5–5.1)
Sodium: 133 mmol/L — ABNORMAL LOW (ref 135–145)
Total Bilirubin: 0.6 mg/dL (ref <1.2)
Total Protein: 6.6 g/dL (ref 6.5–8.1)

## 2023-07-10 LAB — CBC
HCT: 41.4 % (ref 36.0–46.0)
Hemoglobin: 14 g/dL (ref 12.0–15.0)
MCH: 29.5 pg (ref 26.0–34.0)
MCHC: 33.8 g/dL (ref 30.0–36.0)
MCV: 87.2 fL (ref 80.0–100.0)
Platelets: 301 10*3/uL (ref 150–400)
RBC: 4.75 MIL/uL (ref 3.87–5.11)
RDW: 13.2 % (ref 11.5–15.5)
WBC: 7.5 10*3/uL (ref 4.0–10.5)
nRBC: 0 % (ref 0.0–0.2)

## 2023-07-10 LAB — TROPONIN I (HIGH SENSITIVITY): Troponin I (High Sensitivity): 8 ng/L (ref <18)

## 2023-07-10 MED ORDER — SODIUM CHLORIDE 0.9 % IV BOLUS
500.0000 mL | Freq: Once | INTRAVENOUS | Status: AC
  Administered 2023-07-10: 500 mL via INTRAVENOUS

## 2023-07-10 MED ORDER — KETOROLAC TROMETHAMINE 15 MG/ML IJ SOLN
15.0000 mg | Freq: Once | INTRAMUSCULAR | Status: AC
  Administered 2023-07-10: 15 mg via INTRAVENOUS
  Filled 2023-07-10: qty 1

## 2023-07-10 NOTE — Discharge Instructions (Signed)
Your workup today was reassuring.  Please call and schedule a follow-up appointment with your neurologist.  Return to the ER for worsening symptoms.

## 2023-07-10 NOTE — ED Provider Notes (Signed)
[place] EMERGENCY DEPARTMENT AT [facility] Provider Note   CSN: [id number] Arrival date & time: 07/10/23  1613     History  Chief Complaint  Patient presents with   Headache    April Waters is a 72 y.o. female.  72 year old female with past medical history of migraine headaches in the past as well as ocular migraine headaches, rheumatoid arthritis, and asthma presenting to the emergency department today after having a headache this morning.  The patient states that she was having one of her migraine type headaches with some photophobia.  States this was similar but she did have some lightheadedness with this.  She states that her headaches have been more frequent recently.  States that she has been having ocular migraines and did see her ophthalmologist yesterday.  Her workup was favorable there.  She states that when she does have the ocular migraines like she had earlier she sees lines in her vision.  She states that this has resolved by the time she came to the emergency department.  She states that she was feeling very anxious and lightheaded during this episode this afternoon which is what prompted her to come to the ER for further evaluation.  The patient denies any fevers or chills.  Denies any chest pain or shortness of breath.  She came to the ER today for further evaluation due to these ongoing symptoms.  The patient's work appears reassuring.  Labs are unremarkable.  She remained stable here on the monitor.  CT scan is negative.  Her vital signs have normalized.  Think she is stable for discharge.  She is encouraged to follow-up with her neurologist.   Headache      Home Medications Prior to Admission medications   Medication Sig Start Date End Date Taking? Authorizing Provider  acetaminophen (TYLENOL) 500 MG tablet Take 500 mg by mouth every 6 (six) hours as needed. Reported on 11/29/2015    [provider]  albuterol (VENTOLIN HFA) 108 (90 Base)  MCG/ACT inhaler Inhale 2 puffs into the lungs every 4 (four) hours as needed for wheezing or shortness of breath. 04/10/23   Kozlow, Alvira Philips, MD  ascorbic acid (VITAMIN C) 500 MG tablet Take 500 mg by mouth daily.    [provider]  Cyanocobalamin (B-12 PO) Take by mouth.    [provider]  EPINEPHrine (EPIPEN 2-PAK) 0.3 mg/0.3 mL IJ SOAJ injection Inject 0.3 mg into the muscle as needed for anaphylaxis. 06/27/22   Kozlow, Alvira Philips, MD  EPINEPHrine 0.3 mg/0.3 mL IJ SOAJ injection Inject 3 mg into the skin as needed. 02/21/22   Kozlow, Alvira Philips, MD  famotidine (PEPCID) 20 MG tablet Take 1 tablet (20 mg total) by mouth daily. 09/26/22   Kozlow, Alvira Philips, MD  levalbuterol [facility] HFA) 45 MCG/ACT inhaler Inhale 2 puffs into the lungs every 6 (six) hours as needed for wheezing. 09/26/22   Kozlow, Alvira Philips, MD  loratadine (CLARITIN) 10 MG tablet Take 1 tablet (10 mg total) by mouth daily. 04/11/22   Kozlow, Alvira Philips, MD  Magnesium 300 MG CAPS Take 1 capsule by mouth daily.    [provider]  Olopatadine HCl (PATADAY) 0.2 % SOLN Apply 1 drop to eye daily as needed. 02/21/22   Kozlow, Alvira Philips, MD  peppermint oil liquid     [provider]  Propylene Glycol (SYSTANE COMPLETE) 0.6 % SOLN Apply 1 drop to eye in the morning, at noon, in the evening, and  at bedtime. 02/21/22   Kozlow, Alvira Philips, MD  Riboflavin (B2 PO) Take by mouth.    [provider]  sucralfate (CARAFATE) 1 g tablet Take 1 tablet (1 g total) by mouth 4 (four) times daily -  with meals and at bedtime for 10 days. 08/28/22 09/07/22  Benjiman Core, MD  VITAMIN K PO Take by mouth.    [provider]      Allergies    Cardizem [diltiazem hcl], Codeine, Penicillins, Vicodin [hydrocodone-acetaminophen], and Yeast    Review of Systems   Review of Systems  Neurological:  Positive for light-headedness and headaches.  All other systems reviewed and are negative.   Physical Exam Updated Vital Signs BP (!)  147/89   Pulse 96   Temp 97.6 F (36.4 C) (Oral)   Resp 15   Ht 5\' 2"  (1.575 m)   Wt 40.8 kg   SpO2 99%   BMI 16.45 kg/m  Physical Exam Vitals and nursing note reviewed.   Gen: NAD Eyes: PERRL, EOMI HEENT: no oropharyngeal swelling Neck: trachea midline Resp: clear to auscultation bilaterally Card: RRR, no murmurs, rubs, or gallops Abd: nontender, nondistended Extremities: no calf tenderness, no edema Vascular: 2+ radial pulses bilaterally, 2+ DP pulses bilaterally Neuro: Cranial nerves intact, equal strength and sensation throughout bilateral upper and lower extremities with no dysmetria on finger-to-nose testing Skin: no rashes Psyc: acting appropriately   ED Results / Procedures / Treatments   Labs (all labs ordered are listed, but only abnormal results are displayed) Labs Reviewed  COMPREHENSIVE METABOLIC PANEL - Abnormal; Notable for the following components:      Result Value   Sodium 133 (*)    Glucose, Bld 111 (*)    All other components within normal limits  URINALYSIS, ROUTINE W REFLEX MICROSCOPIC - Abnormal; Notable for the following components:   APPearance HAZY (*)    All other components within normal limits  CBC  TROPONIN I (HIGH SENSITIVITY)  TROPONIN I (HIGH SENSITIVITY)    EKG None  Radiology CT Head Wo Contrast  Result Date: 07/10/2023 CLINICAL DATA:  Headache, increasing frequency the or severity EXAM: CT HEAD WITHOUT CONTRAST TECHNIQUE: Contiguous axial images were obtained from the base of the skull through the vertex without intravenous contrast. RADIATION DOSE REDUCTION: This exam was performed according to the departmental dose-optimization program which includes automated exposure control, adjustment of the mA and/or kV according to patient size and/or use of iterative reconstruction technique. COMPARISON:  02/06/2017 FINDINGS: Brain: No evidence of acute infarction, hemorrhage, mass, mass effect, or midline shift. No hydrocephalus or  extra-axial fluid collection. Vascular: No hyperdense vessel. Skull: Negative for fracture or focal lesion. Sinuses/Orbits: No acute finding. Other: The mastoid air cells are well aerated. IMPRESSION: No acute intracranial process. Electronically Signed   By: Wiliam Ke M.D.   On: 07/10/2023 18:36    Procedures Procedures    Medications Ordered in ED Medications  sodium chloride 0.9 % bolus 500 mL (0 mLs Intravenous Stopped 07/10/23 1919)  ketorolac (TORADOL) 15 MG/ML injection 15 mg (15 mg Intravenous Given 07/10/23 2019)    ED Course/ Medical Decision Making/ A&P                                 Medical Decision Making 72 year old female with past medical history of migraine headaches as well as asthma and rheumatoid arthritis presenting to the emergency department today with headache and lightheadedness.  Given the lightheadedness I will obtain an EKG as well as basic labs to evaluate for anemia or electrolyte abnormalities.  Acute patient on the cardiac monitor here.  The patient is unable to tolerate Compazine, Decadron, or Reglan in the past.  I will hold off on these at this time.  Obtain a CT scan of her head to evaluate for intracranial hemorrhage or mass lesion given the patient's age and with the increasing frequency of her headaches.  I will give her some IV fluids here as she is mildly tachycardic here.  Based on description of her symptoms suspicion for pulmonary embolism is low at this time.  She reports that she is feeling back to baseline.  Heart rate is in the 90s and low 100s here.  I will reevaluate for ultimate disposition  Amount and/or Complexity of Data Reviewed Labs: ordered. Radiology: ordered.  Risk Prescription drug management.           Final Clinical Impression(s) / ED Diagnoses Final diagnoses:  Nonintractable headache, unspecified chronicity pattern, unspecified headache type    Rx / DC Orders ED Discharge Orders     None         Durwin Glaze, MD 07/10/23 2101

## 2023-07-10 NOTE — ED Triage Notes (Signed)
Hx of migraines. Here for headache with tremors x 10 days with HR of 110 with EKG showing ST with PACs. Alert and oriented x 4. 250 cc bolus given en route. Initial HR was 156 bpm.   EMS VS; 162/88 CBG 110 96% on RA

## 2023-07-10 NOTE — ED Notes (Signed)
Pt. Up to restroom with even steady gait

## 2023-07-12 ENCOUNTER — Ambulatory Visit: Payer: Medicare Other | Admitting: Family Medicine

## 2023-07-16 ENCOUNTER — Ambulatory Visit (INDEPENDENT_AMBULATORY_CARE_PROVIDER_SITE_OTHER): Payer: Medicare Other

## 2023-07-16 DIAGNOSIS — J309 Allergic rhinitis, unspecified: Secondary | ICD-10-CM

## 2023-07-23 ENCOUNTER — Ambulatory Visit: Payer: Medicare Other | Admitting: Cardiology

## 2023-07-30 ENCOUNTER — Ambulatory Visit (INDEPENDENT_AMBULATORY_CARE_PROVIDER_SITE_OTHER): Payer: Self-pay | Admitting: *Deleted

## 2023-07-30 DIAGNOSIS — J309 Allergic rhinitis, unspecified: Secondary | ICD-10-CM

## 2023-08-03 ENCOUNTER — Ambulatory Visit (INDEPENDENT_AMBULATORY_CARE_PROVIDER_SITE_OTHER): Payer: Medicare Other

## 2023-08-03 ENCOUNTER — Encounter: Payer: Self-pay | Admitting: *Deleted

## 2023-08-03 ENCOUNTER — Ambulatory Visit
Admission: EM | Admit: 2023-08-03 | Discharge: 2023-08-03 | Disposition: A | Discharge status: Home / Self Care | Payer: Medicare Other | Attending: Physician Assistant | Admitting: Physician Assistant

## 2023-08-03 ENCOUNTER — Other Ambulatory Visit: Payer: Self-pay

## 2023-08-03 DIAGNOSIS — R1084 Generalized abdominal pain: Secondary | ICD-10-CM

## 2023-08-03 DIAGNOSIS — R140 Abdominal distension (gaseous): Secondary | ICD-10-CM | POA: Insufficient documentation

## 2023-08-03 DIAGNOSIS — R000 Tachycardia, unspecified: Secondary | ICD-10-CM | POA: Insufficient documentation

## 2023-08-03 DIAGNOSIS — K5900 Constipation, unspecified: Secondary | ICD-10-CM | POA: Diagnosis present

## 2023-08-03 LAB — POCT URINALYSIS DIP (MANUAL ENTRY)
Bilirubin, UA: NEGATIVE
Blood, UA: NEGATIVE
Glucose, UA: NEGATIVE mg/dL
Ketones, POC UA: NEGATIVE mg/dL
Leukocytes, UA: NEGATIVE
Nitrite, UA: NEGATIVE
Protein Ur, POC: NEGATIVE mg/dL
Spec Grav, UA: 1.015 (ref 1.010–1.025)
Urobilinogen, UA: 0.2 U/dL
pH, UA: 7 (ref 5.0–8.0)

## 2023-08-03 MED ORDER — LACTULOSE 10 GM/15ML PO SOLN: 10.0000 g | Freq: Every day | ORAL | 0 refills | Status: AC | PRN

## 2023-08-03 MED ORDER — ALUM & MAG HYDROXIDE-SIMETH 200-200-20 MG/5ML PO SUSP
30.0000 mL | Freq: Once | ORAL | Status: AC
  Administered 2023-08-03: 30 mL via ORAL

## 2023-08-03 NOTE — ED Triage Notes (Signed)
Pt reports she has "ocular migraine" frequently. Had one yesterday. She has been evaluated for these before in the ED. States the frequency has increased. Today she c/o abdominal pain, bloating, nausea. She ? if it is related to the migraine. States she has multiple food allergies. States "something is not right with my stomach". She does not have a PCP since hers retired. Also reports disrupted sleep and feeling "exhausted"

## 2023-08-03 NOTE — ED Provider Notes (Signed)
[facility]    CSN: [id number] Arrival date & time: 08/03/23  1419      History   Chief Complaint Chief Complaint  Patient presents with   Abdominal Pain   Nausea    HPI April Waters is a 72 y.o. female.   Patient presents today with a constellation of symptoms.  Her primary concern today is several week history of abdominal pain.  Symptoms are worse after she eats but generally she is having ongoing pain with associated bloating and discomfort.  She has previously had C. difficile as well as a parasitic infection and so is requesting that we run her stool.  She is having solid bowel movements but is going frequently.  She reports nausea but denies any vomiting.  She reports abdominal pain is currently rated 4/5 on a 0-10 pain scale, generalized throughout her abdomen, no alleviating factors identified.  She denies any melena, hematochezia, vomiting, hematemesis.  She does have an appointment with gastroenterology but not until February 2024.  Denies any recent abdominal surgery.  She is also having an increased frequency of ocular migraines which are occurring every few days.  She does have a history of this and has seen an ophthalmologist earlier this month.  She also went to the emergency room after this visit because of her recurrent symptoms and had a normal head CT but has not had any imaging of her abdomen.  She describes this as bright lights in her vision that lasts for a few minutes and then resolved with resting quietly and applying ice to her eyes.  She has also been taking ibuprofen and Tylenol to help manage the symptoms.  She denies any focal weakness, dysarthria.  She does not currently have a primary care provider.    Past Medical History:  Diagnosis Date   Allergy    Asthma    C. difficile enteritis    Chicken pox    Common migraine with intractable migraine 05/26/2015   Malachi Carl infection 1986   lasted about a year late diagnosis   Measles     Osteoporosis    Rheumatoid arthritis [facility])     Patient Active Problem List   Diagnosis Date Noted   Genital herpes simplex 02/28/2023   Pulmonary nodule 08/25/2020   Osteopenia 07/05/2020   Osteoporosis 07/05/2020   Allergic rhinitis due to pollen 11/29/2015   Mild intermittent asthma 11/29/2015   Weight loss 11/29/2015   Rheumatoid arthritis (HCC) 11/29/2015   Headache, migraine 11/29/2015   Common migraine with intractable migraine 05/26/2015   Routine general medical examination at a health care facility 04/09/2012   Rapid heart rate 04/09/2012   Dysplastic nevus of trunk 06/20/2011    Past Surgical History:  Procedure Laterality Date   ANTERIOR CRUCIATE LIGAMENT REPAIR  09/04/1996   right   BREAST CYST EXCISION Right    when she was 15   cebaceous cyst     right breast - age 57   fluid knee  09/04/1992   left   HERNIA REPAIR     bilateral inguinal 2 months   KIDNEY STONE SURGERY     1980   KNEE SURGERY  09/04/1988   medial meniscous right   LAPAROSCOPY  09/05/1991   endometriosis   nasal polyps     age 38    OB History   No obstetric history on file.      Home Medications    Prior to Admission medications   Medication Sig Start  Date End Date Taking? Authorizing Provider  acetaminophen (TYLENOL) 325 MG tablet Take 325 mg by mouth every 6 (six) hours as needed for moderate pain (pain score 4-6), mild pain (pain score 1-3), fever or headache. 02/02/16  Yes [provider]  fluticasone (CUTIVATE) 0.05 % cream Apply 1 Application topically 2 (two) times daily. 06/26/23  Yes [provider]  lactulose (CHRONULAC) 10 GM/15ML solution Take 15 mLs (10 g total) by mouth daily as needed for mild constipation. 08/03/23  Yes Evoleth Nordmeyer, Noberto Retort, PA-C  acetaminophen (TYLENOL) 500 MG tablet Take 500 mg by mouth every 6 (six) hours as needed. Reported on 11/29/2015    [provider]  acyclovir ointment (ZOVIRAX) 5 % Apply 1 Application topically every  3 (three) hours.    [provider]  albuterol (VENTOLIN HFA) 108 (90 Base) MCG/ACT inhaler Inhale 2 puffs into the lungs every 4 (four) hours as needed for wheezing or shortness of breath. 04/10/23   Kozlow, Alvira Philips, MD  ascorbic acid (VITAMIN C) 500 MG tablet Take 500 mg by mouth daily.    [provider]  Calcium Carbonate-Vit D-Min (CALCIUM 600+D PLUS MINERALS) 600-400 MG-UNIT TABS Take 1 tablet by mouth as directed.    [provider]  Cholecalciferol 10 MCG (400 UNIT) CAPS Take 1 capsule by mouth daily. 09/08/20   [provider]  Cyanocobalamin (B-12 PO) Take by mouth.    [provider]  Cyanocobalamin (VITAMIN B-12 PO) Take 1 tablet by mouth daily.    [provider]  EPINEPHrine (EPIPEN 2-PAK) 0.3 mg/0.3 mL IJ SOAJ injection Inject 0.3 mg into the muscle as needed for anaphylaxis. 06/27/22   Kozlow, Alvira Philips, MD  EPINEPHrine 0.3 mg/0.3 mL IJ SOAJ injection Inject 3 mg into the skin as needed. 02/21/22   Kozlow, Alvira Philips, MD  EPINEPHrine 0.3 mg/0.3 mL IJ SOAJ injection Inject 0.3 mg into the muscle as needed for anaphylaxis.    [provider]  famotidine (PEPCID) 20 MG tablet Take 1 tablet (20 mg total) by mouth daily. 09/26/22   Kozlow, Alvira Philips, MD  levalbuterol [facility] HFA) 45 MCG/ACT inhaler Inhale 2 puffs into the lungs every 6 (six) hours as needed for wheezing. 09/26/22   Kozlow, Alvira Philips, MD  loratadine (CLARITIN) 10 MG tablet Take 1 tablet (10 mg total) by mouth daily. 04/11/22   Kozlow, Alvira Philips, MD  Magnesium 300 MG CAPS Take 1 capsule by mouth daily.    [provider]  meloxicam (MOBIC) 15 MG tablet Take 1 tablet by mouth daily.    [provider]  Olopatadine HCl (PATADAY) 0.2 % SOLN Apply 1 drop to eye daily as needed. 02/21/22   Kozlow, Alvira Philips, MD  peppermint oil liquid     [provider]  PEPPERMINT OIL PO     [provider]  Propylene Glycol (SYSTANE COMPLETE) 0.6 % SOLN Apply 1 drop to eye  in the morning, at noon, in the evening, and at bedtime. 02/21/22   Kozlow, Alvira Philips, MD  Riboflavin (B2 PO) Take by mouth.    [provider]  sucralfate (CARAFATE) 1 g tablet Take 1 tablet (1 g total) by mouth 4 (four) times daily -  with meals and at bedtime for 10 days. 08/28/22 09/07/22  Benjiman Core, MD  VITAMIN K PO Take by mouth.    [provider]    Family History Family History  Problem Relation Age of Onset   Hypertension Mother  Diabetes Mother    Migraines Mother    Heart attack Father    Lung cancer Father    Allergic rhinitis Sister    Asthma Sister    Allergic rhinitis Sister    Colon cancer Maternal Grandmother    Angioedema Neg Hx    Eczema Neg Hx    Immunodeficiency Neg Hx    Urticaria Neg Hx    Atopy Neg Hx    Breast cancer Neg Hx    Stomach cancer Neg Hx    Pancreatic cancer Neg Hx    Esophageal cancer Neg Hx     Social History Social History   Tobacco Use   Smoking status: Former    Current packs/day: 0.00    Types: Cigarettes    Quit date: 09/04/1984    Years since quitting: 38.9   Smokeless tobacco: Never  Vaping Use   Vaping status: Never Used  Substance Use Topics   Alcohol use: No    Alcohol/week: 0.0 standard drinks of alcohol   Drug use: No     Allergies   Cardizem [diltiazem hcl], Codeine, Hydrocodone, Hydromorphone hcl, Oxycodone, Tyloxapol, Vicodin [hydrocodone-acetaminophen], Diltiazem hcl, Hydrocodone-acetaminophen, Hydromorphone, Oxycodone-acetaminophen, Penicillins, and Yeast   Review of Systems Review of Systems  Constitutional:  Positive for activity change and fatigue. Negative for appetite change and fever.  Eyes:  Positive for visual disturbance. Negative for photophobia.  Respiratory:  Negative for cough and shortness of breath.   Cardiovascular:  Negative for chest pain and palpitations.  Gastrointestinal:  Positive for abdominal pain and nausea. Negative for blood in stool, constipation, diarrhea  and vomiting.  Neurological:  Positive for headaches. Negative for dizziness, weakness and light-headedness.     Physical Exam Triage Vital Signs ED Triage Vitals  Encounter Vitals Group     BP 08/03/23 1510 (!) 159/98     Systolic BP Percentile --      Diastolic BP Percentile --      Pulse Rate 08/03/23 1510 (!) 119     Resp 08/03/23 1510 20     Temp 08/03/23 1510 98 F (36.7 C)     Temp Source 08/03/23 1510 Oral     SpO2 08/03/23 1510 100 %     Weight --      Height --      Head Circumference --      Peak Flow --      Pain Score 08/03/23 1507 6     Pain Loc --      Pain Education --      Exclude from Growth Chart --    No data found.  Updated Vital Signs BP (!) 159/98 (BP Location: Left Arm)   Pulse (!) 105   Temp 98 F (36.7 C) (Oral)   Resp 20   SpO2 98%   Visual Acuity Right Eye Distance:   Left Eye Distance:   Bilateral Distance:    Right Eye Near:   Left Eye Near:    Bilateral Near:     Physical Exam Vitals reviewed.  Constitutional:      General: She is awake. She is not in acute distress.    Appearance: Normal appearance. She is well-developed. She is not ill-appearing.     Comments: Very pleasant female appears stated age in no acute distress sitting comfortably on exam room table  HENT:     Head: Normocephalic and atraumatic.  Cardiovascular:     Rate and Rhythm: Regular rhythm. Tachycardia present.     Heart  sounds: Normal heart sounds, S1 normal and S2 normal. No murmur heard. Pulmonary:     Effort: Pulmonary effort is normal.     Breath sounds: Normal breath sounds. No wheezing, rhonchi or rales.     Comments: Clear to auscultation bilaterally Abdominal:     General: Bowel sounds are normal.     Palpations: Abdomen is soft.     Tenderness: There is generalized abdominal tenderness. There is no right CVA tenderness, left CVA tenderness, guarding or rebound.     Comments: Generalized tenderness to palpation.  Psychiatric:        Behavior:  Behavior is cooperative.      UC Treatments / Results  Labs (all labs ordered are listed, but only abnormal results are displayed) Labs Reviewed  OVA + PARASITE EXAM  CBC WITH DIFFERENTIAL/PLATELET  COMPREHENSIVE METABOLIC PANEL  POCT URINALYSIS DIP (MANUAL ENTRY)    EKG   Radiology DG Abd 2 Views  Result Date: 08/03/2023 CLINICAL DATA:  Pain and bloating. EXAM: ABDOMEN - 2 VIEW COMPARISON:  CT 08/09/2020. FINDINGS: Moderate colonic stool. Gas is seen in nondilated loops of small and large bowel. No obstruction. No obvious free air on the upright view. Moderate curvature of the spine and associated degenerative changes. Degenerative changes of the pelvis. There is a focal lobular area of high density in the lateral left hemipelvis. Uncertain this represents calcification or bowel luminal debris. IMPRESSION: Moderate colonic stool.  Nonspecific bowel gas pattern. Lobular area of high density along lateral left hemipelvis. Uncertain if this represents bowel luminal contents versus a pelvic calcification. Please correlate for any known history or more recent prior abdominal film. Additional evaluation of the patient's symptoms as clinically appropriate with CT Electronically Signed   By: Karen Kays M.D.   On: 08/03/2023 16:30    Procedures Procedures (including critical care time)  Medications Ordered in UC Medications  alum & mag hydroxide-simeth (MAALOX/MYLANTA) 200-200-20 MG/5ML suspension 30 mL (30 mLs Oral Given 08/03/23 1553)    Initial Impression / Assessment and Plan / UC Course  I have reviewed the triage vital signs and the nursing notes.  Pertinent labs & imaging results that were available during my care of the patient were reviewed by me and considered in my medical decision making (see chart for details).     Patient was initially tachycardic but otherwise well-appearing, afebrile, nontoxic.  She did have some tenderness on exam and we discussed potentially to  leave going to the emergency room but patient declined to do this as her symptoms were not severe and she did not believe that she needed to do this at this time.  EKG was obtained and she was initially recorded as having a heart rate of 119 which showed normal sinus rhythm with ventricular rate of 97 bpm without ischemic changes or significant change from previous which was obtained 07/10/2023.  She was given Maalox with some improvement of symptoms.  CBC and CMP were obtained and are pending.  KUB was obtained that showed significant stool burden without evidence of obstruction.  We did discuss that CT would be reasonable but she continued to decline ER evaluation at this time and will follow-up outpatient with gastroenterology.  Will start lactulose as I suspect constipation is contributing to her symptoms.  She was encouraged to lifestyle and dietary modification for additional symptom relief.  She is very concerned about potentially having a parasitic infection as has happened before with similar presentation.  O&P was obtained based on a  stool specimen she was able to provide in clinic but will defer additional testing for the time being.  We discussed at length that if her symptoms do not improving within 24 hours she should go to the ER.  Also discussed that if anything worsens and she has severe abdominal pain, fever, nausea/vomiting, blood in her stool she needs to be seen emergently.  Strict return precautions given.  All questions were answered to patient satisfaction.  Final Clinical Impressions(s) / UC Diagnoses   Final diagnoses:  Generalized abdominal pain  Constipation, unspecified constipation type  Tachycardia     Discharge Instructions      I will contact you if any of your workup is abnormal.  I see a lot of stool on your x-ray but no evidence of an obstruction.  I will contact you if the radiologist sees something different.  Start lactulose daily to encourage a daily bowel  movement.  Make sure that you are drinking plenty fluid and eat plenty of fiber.  Follow-up with GI specialist as soon as possible.  Someone should call you to schedule an appointment with primary care and if you do not hear from them within a week please return for reevaluation.  As we discussed, if your symptoms or not improving or if anything worsens you have severe abdominal pain, nausea, vomiting, difficulty passing a bowel movement, fever, weakness that you need to go to the emergency room.     ED Prescriptions     Medication Sig Dispense Auth. Provider   lactulose (CHRONULAC) 10 GM/15ML solution Take 15 mLs (10 g total) by mouth daily as needed for mild constipation. 236 mL Akshith Moncus K, PA-C      PDMP not reviewed this encounter.   Jeani Hawking, PA-C 08/03/23 1648

## 2023-08-03 NOTE — Discharge Instructions (Signed)
I will contact you if any of your workup is abnormal.  I see a lot of stool on your x-ray but no evidence of an obstruction.  I will contact you if the radiologist sees something different.  Start lactulose daily to encourage a daily bowel movement.  Make sure that you are drinking plenty fluid and eat plenty of fiber.  Follow-up with GI specialist as soon as possible.  Someone should call you to schedule an appointment with primary care and if you do not hear from them within a week please return for reevaluation.  As we discussed, if your symptoms or not improving or if anything worsens you have severe abdominal pain, nausea, vomiting, difficulty passing a bowel movement, fever, weakness that you need to go to the emergency room.

## 2023-08-04 LAB — COMPREHENSIVE METABOLIC PANEL
ALT: 31 [IU]/L (ref 0–32)
AST: 32 [IU]/L (ref 0–40)
Albumin: 4.6 g/dL (ref 3.8–4.8)
Alkaline Phosphatase: 61 [IU]/L (ref 44–121)
BUN/Creatinine Ratio: 23 (ref 12–28)
BUN: 12 mg/dL (ref 8–27)
Bilirubin Total: 0.2 mg/dL (ref 0.0–1.2)
CO2: 28 mmol/L (ref 20–29)
Calcium: 9.9 mg/dL (ref 8.7–10.3)
Chloride: 95 mmol/L — ABNORMAL LOW (ref 96–106)
Creatinine, Ser: 0.53 mg/dL — ABNORMAL LOW (ref 0.57–1.00)
Globulin, Total: 2.4 g/dL (ref 1.5–4.5)
Glucose: 87 mg/dL (ref 70–99)
Potassium: 4.8 mmol/L (ref 3.5–5.2)
Sodium: 134 mmol/L (ref 134–144)
Total Protein: 7 g/dL (ref 6.0–8.5)
eGFR: 99 mL/min/{1.73_m2} (ref >59)

## 2023-08-04 LAB — CBC WITH DIFFERENTIAL/PLATELET
Basophils Absolute: 0.1 10*3/uL (ref 0.0–0.2)
Basos: 1 %
EOS (ABSOLUTE): 0.2 10*3/uL (ref 0.0–0.4)
Eos: 3 %
Hematocrit: 44 % (ref 34.0–46.6)
Hemoglobin: 14.6 g/dL (ref 11.1–15.9)
Immature Grans (Abs): 0 10*3/uL (ref 0.0–0.1)
Immature Granulocytes: 0 %
Lymphocytes Absolute: 1.1 10*3/uL (ref 0.7–3.1)
Lymphs: 17 %
MCH: 29.7 pg (ref 26.6–33.0)
MCHC: 33.2 g/dL (ref 31.5–35.7)
MCV: 89 fL (ref 79–97)
Monocytes Absolute: 0.8 10*3/uL (ref 0.1–0.9)
Monocytes: 12 %
Neutrophils Absolute: 4.5 10*3/uL (ref 1.4–7.0)
Neutrophils: 67 %
Platelets: 348 10*3/uL (ref 150–450)
RBC: 4.92 x10E6/uL (ref 3.77–5.28)
RDW: 13.1 % (ref 11.7–15.4)
WBC: 6.6 10*3/uL (ref 3.4–10.8)

## 2023-08-06 ENCOUNTER — Other Ambulatory Visit: Payer: Self-pay

## 2023-08-06 ENCOUNTER — Encounter (HOSPITAL_COMMUNITY): Payer: Self-pay

## 2023-08-06 ENCOUNTER — Emergency Department (HOSPITAL_COMMUNITY)
Admission: EM | Admit: 2023-08-06 | Discharge: 2023-08-07 | Disposition: A | Discharge status: Home / Self Care | Payer: Medicare Other | Attending: Emergency Medicine | Admitting: Emergency Medicine

## 2023-08-06 DIAGNOSIS — R110 Nausea: Secondary | ICD-10-CM | POA: Diagnosis not present

## 2023-08-06 DIAGNOSIS — R1084 Generalized abdominal pain: Secondary | ICD-10-CM | POA: Diagnosis present

## 2023-08-06 DIAGNOSIS — R112 Nausea with vomiting, unspecified: Secondary | ICD-10-CM

## 2023-08-06 LAB — LIPASE, BLOOD: Lipase: 55 U/L — ABNORMAL HIGH (ref 11–51)

## 2023-08-06 LAB — URINALYSIS, ROUTINE W REFLEX MICROSCOPIC
Bilirubin Urine: NEGATIVE
Glucose, UA: NEGATIVE mg/dL
Hgb urine dipstick: NEGATIVE
Ketones, ur: NEGATIVE mg/dL
Leukocytes,Ua: NEGATIVE
Nitrite: NEGATIVE
Protein, ur: NEGATIVE mg/dL
Specific Gravity, Urine: 1.014 (ref 1.005–1.030)
pH: 6 (ref 5.0–8.0)

## 2023-08-06 LAB — CBC WITH DIFFERENTIAL/PLATELET
Abs Immature Granulocytes: 0.01 10*3/uL (ref 0.00–0.07)
Basophils Absolute: 0.1 10*3/uL (ref 0.0–0.1)
Basophils Relative: 1 %
Eosinophils Absolute: 0.2 10*3/uL (ref 0.0–0.5)
Eosinophils Relative: 3 %
HCT: 44.2 % (ref 36.0–46.0)
Hemoglobin: 14.8 g/dL (ref 12.0–15.0)
Immature Granulocytes: 0 %
Lymphocytes Relative: 16 %
Lymphs Abs: 1.1 10*3/uL (ref 0.7–4.0)
MCH: 30.1 pg (ref 26.0–34.0)
MCHC: 33.5 g/dL (ref 30.0–36.0)
MCV: 90 fL (ref 80.0–100.0)
Monocytes Absolute: 0.6 10*3/uL (ref 0.1–1.0)
Monocytes Relative: 8 %
Neutro Abs: 4.9 10*3/uL (ref 1.7–7.7)
Neutrophils Relative %: 72 %
Platelets: 300 10*3/uL (ref 150–400)
RBC: 4.91 MIL/uL (ref 3.87–5.11)
RDW: 13.1 % (ref 11.5–15.5)
WBC: 6.8 10*3/uL (ref 4.0–10.5)
nRBC: 0 % (ref 0.0–0.2)

## 2023-08-06 LAB — COMPREHENSIVE METABOLIC PANEL
ALT: 37 U/L (ref 0–44)
AST: 36 U/L (ref 15–41)
Albumin: 4.2 g/dL (ref 3.5–5.0)
Alkaline Phosphatase: 52 U/L (ref 38–126)
Anion gap: 9 (ref 5–15)
BUN: 16 mg/dL (ref 8–23)
CO2: 24 mmol/L (ref 22–32)
Calcium: 9 mg/dL (ref 8.9–10.3)
Chloride: 101 mmol/L (ref 98–111)
Creatinine, Ser: 0.53 mg/dL (ref 0.44–1.00)
GFR, Estimated: >60 mL/min (ref >60)
Glucose, Bld: 85 mg/dL (ref 70–99)
Potassium: 4.2 mmol/L (ref 3.5–5.1)
Sodium: 134 mmol/L — ABNORMAL LOW (ref 135–145)
Total Bilirubin: 0.6 mg/dL (ref <1.2)
Total Protein: 6.9 g/dL (ref 6.5–8.1)

## 2023-08-06 MED ORDER — ONDANSETRON HCL 4 MG/2ML IJ SOLN
4.0000 mg | Freq: Once | INTRAMUSCULAR | Status: AC
  Administered 2023-08-06: 4 mg via INTRAVENOUS
  Filled 2023-08-06: qty 2

## 2023-08-06 NOTE — ED Provider Triage Note (Signed)
Emergency Medicine Provider Triage Evaluation Note  Lylah Hegstad , a 72 y.o. female  was evaluated in triage.  Pt complains of Abd pain.  Review of Systems  Positive: Abd pain, headache Negative: Fever, vomiting,   Physical Exam  BP 113/85 (BP Location: Left Arm)   Pulse 95   Temp (!) 97.5 F (36.4 C) (Oral)   Resp 18   Ht 5\' 2"  (1.575 m)   Wt 41.3 kg   SpO2 97%   BMI 16.64 kg/m  Gen:   Awake, no distress   Resp:  Normal effort  MSK:   Moves extremities without difficulty  Other:    Medical Decision Making  Medically screening exam initiated at 3:23 PM.  Appropriate orders placed.  Ambreal Balthrop was informed that the remainder of the evaluation will be completed by another provider, this initial triage assessment does not replace that evaluation, and the importance of remaining in the ED until their evaluation is complete.  Has recurrent headaches, abdominal pain due to food allergies. Now hving pain that is worse over one month.   Elpidio Anis, PA-C 08/06/23 1524

## 2023-08-06 NOTE — ED Triage Notes (Signed)
Per EMS, pt is coming from home. Pt endorses headache, headache, nausea, and emesis if she eats.

## 2023-08-06 NOTE — ED Provider Notes (Signed)
[place] [facility] AT [facility] Provider Note   CSN: [id number] Arrival date & time: 08/06/23  1504     History  Chief Complaint  Patient presents with   Abdominal Pain    April Waters is a 72 y.o. female.  HPI   72 year old female presents emergency department with multiple complaints.  She complains of acute on chronic abdominal pain associated with nausea as well as ongoing ocular migraines.  The ocular migraines that she has been having are very characteristic for her, she does not currently have 1.  She has no vision loss or other acute neurologic symptoms.  In regards to the abdominal pain she is complaining of generalized abdominal pain, bloating and nausea that is worsening and more persistent.  She is an appointment with GI but not until February.  Denies any fever or genitourinary symptoms.  Home Medications Prior to Admission medications   Medication Sig Start Date End Date Taking? Authorizing Provider  acetaminophen (TYLENOL) 325 MG tablet Take 325 mg by mouth every 6 (six) hours as needed for moderate pain (pain score 4-6), mild pain (pain score 1-3), fever or headache. 02/02/16   [provider]  acetaminophen (TYLENOL) 500 MG tablet Take 500 mg by mouth every 6 (six) hours as needed. Reported on 11/29/2015    [provider]  acyclovir ointment (ZOVIRAX) 5 % Apply 1 Application topically every 3 (three) hours.    [provider]  albuterol (VENTOLIN HFA) 108 (90 Base) MCG/ACT inhaler Inhale 2 puffs into the lungs every 4 (four) hours as needed for wheezing or shortness of breath. 04/10/23   Kozlow, Alvira Philips, MD  ascorbic acid (VITAMIN C) 500 MG tablet Take 500 mg by mouth daily.    [provider]  Calcium Carbonate-Vit D-Min (CALCIUM 600+D PLUS MINERALS) 600-400 MG-UNIT TABS Take 1 tablet by mouth as directed.    [provider]  Cholecalciferol 10 MCG (400 UNIT) CAPS Take 1 capsule by mouth daily. 09/08/20    [provider]  Cyanocobalamin (B-12 PO) Take by mouth.    [provider]  Cyanocobalamin (VITAMIN B-12 PO) Take 1 tablet by mouth daily.    [provider]  EPINEPHrine (EPIPEN 2-PAK) 0.3 mg/0.3 mL IJ SOAJ injection Inject 0.3 mg into the muscle as needed for anaphylaxis. 06/27/22   Kozlow, Alvira Philips, MD  EPINEPHrine 0.3 mg/0.3 mL IJ SOAJ injection Inject 3 mg into the skin as needed. 02/21/22   Kozlow, Alvira Philips, MD  EPINEPHrine 0.3 mg/0.3 mL IJ SOAJ injection Inject 0.3 mg into the muscle as needed for anaphylaxis.    [provider]  famotidine (PEPCID) 20 MG tablet Take 1 tablet (20 mg total) by mouth daily. 09/26/22   Kozlow, Alvira Philips, MD  fluticasone (CUTIVATE) 0.05 % cream Apply 1 Application topically 2 (two) times daily. 06/26/23   [provider]  lactulose (CHRONULAC) 10 GM/15ML solution Take 15 mLs (10 g total) by mouth daily as needed for mild constipation. 08/03/23   Raspet, Noberto Retort, PA-C  levalbuterol (XOPENEX HFA) 45 MCG/ACT inhaler Inhale 2 puffs into the lungs every 6 (six) hours as needed for wheezing. 09/26/22   Kozlow, Alvira Philips, MD  loratadine (CLARITIN) 10 MG tablet Take 1 tablet (10 mg total) by mouth daily. 04/11/22   Kozlow, Alvira Philips, MD  Magnesium 300 MG CAPS Take 1 capsule by mouth daily.    [provider]  meloxicam (MOBIC) 15 MG tablet Take 1 tablet by mouth daily.  [provider]  Olopatadine HCl (PATADAY) 0.2 % SOLN Apply 1 drop to eye daily as needed. 02/21/22   Kozlow, Alvira Philips, MD  peppermint oil liquid     [provider]  PEPPERMINT OIL PO     [provider]  Propylene Glycol (SYSTANE COMPLETE) 0.6 % SOLN Apply 1 drop to eye in the morning, at noon, in the evening, and at bedtime. 02/21/22   Kozlow, Alvira Philips, MD  Riboflavin (B2 PO) Take by mouth.    [provider]  sucralfate (CARAFATE) 1 g tablet Take 1 tablet (1 g total) by mouth 4 (four) times daily -  with meals and at bedtime for 10  days. 08/28/22 09/07/22  Benjiman Core, MD  VITAMIN K PO Take by mouth.    [provider]      Allergies    Cardizem [diltiazem hcl], Codeine, Hydrocodone, Hydromorphone hcl, Oxycodone, Tyloxapol, Vicodin [hydrocodone-acetaminophen], Diltiazem hcl, Hydrocodone-acetaminophen, Hydromorphone, Oxycodone-acetaminophen, Penicillins, and Yeast    Review of Systems   Review of Systems  Constitutional:  Positive for appetite change and fatigue. Negative for fever.  Respiratory:  Negative for shortness of breath.   Cardiovascular:  Negative for chest pain.  Gastrointestinal:  Positive for abdominal pain and nausea. Negative for diarrhea and vomiting.  Skin:  Negative for rash.  Neurological:  Positive for headaches. Negative for facial asymmetry, speech difficulty and weakness.    Physical Exam Updated Vital Signs BP 118/80   Pulse 95   Temp 97.6 F (36.4 C)   Resp 16   Ht 5\' 2"  (1.575 m)   Wt 41.3 kg   SpO2 100%   BMI 16.64 kg/m  Physical Exam Vitals and nursing note reviewed.  Constitutional:      General: She is not in acute distress.    Appearance: Normal appearance.  HENT:     Head: Normocephalic.     Mouth/Throat:     Mouth: Mucous membranes are moist.  Cardiovascular:     Rate and Rhythm: Normal rate.  Pulmonary:     Effort: Pulmonary effort is normal. No respiratory distress.  Abdominal:     General: Bowel sounds are normal. There is no distension.     Palpations: Abdomen is soft.     Tenderness: There is generalized abdominal tenderness. There is guarding and rebound.  Skin:    General: Skin is warm.  Neurological:     General: No focal deficit present.     Mental Status: She is alert and oriented to person, place, and time. Mental status is at baseline.  Psychiatric:        Mood and Affect: Mood normal.     ED Results / Procedures / Treatments   Labs (all labs ordered are listed, but only abnormal results are displayed) Labs Reviewed   COMPREHENSIVE METABOLIC PANEL - Abnormal; Notable for the following components:      Result Value   Sodium 134 (*)    All other components within normal limits  LIPASE, BLOOD - Abnormal; Notable for the following components:   Lipase 55 (*)    All other components within normal limits  CBC WITH DIFFERENTIAL/PLATELET  URINALYSIS, ROUTINE W REFLEX MICROSCOPIC    EKG None  Radiology No results found.  Procedures Procedures    Medications Ordered in ED Medications - No data to display  ED Course/ Medical Decision Making/ A&P  Medical Decision Making  72 year old female presents emergency department with ongoing ocular migraines as well as acute on chronic abdominal pain with nausea.  Is scheduled for outpatient GI follow-up in 2 months.  Vitals are normal and stable.  Blood work is very reassuring with no acute abnormalities outside of a slightly elevated lipase.  However on physical exam patient has involuntary guarding and some rebound tenderness.  She has not recently had a CT of the abdomen pelvis for further evaluation so we will plan to do this to rule out any other acute finding.  In regards to the ocular migraines, besides increased frequency these are very characteristic for her.  She recently had a CT of the head after being seen for complaint of ocular migraines with no acute changes.  Will defer to outpatient evaluation.  Patient signed out pending CT of the abdomen pelvis and symptomatic treatment.        Final Clinical Impression(s) / ED Diagnoses Final diagnoses:  None    Rx / DC Orders ED Discharge Orders     None         Rozelle Logan, DO 08/06/23 2339

## 2023-08-07 ENCOUNTER — Emergency Department (HOSPITAL_COMMUNITY): Payer: Medicare Other

## 2023-08-07 DIAGNOSIS — R1084 Generalized abdominal pain: Secondary | ICD-10-CM | POA: Diagnosis not present

## 2023-08-07 LAB — CBG MONITORING, ED: Glucose-Capillary: 79 mg/dL (ref 70–99)

## 2023-08-07 MED ORDER — ONDANSETRON 8 MG PO TBDP: ORAL_TABLET | ORAL | 0 refills | Status: AC

## 2023-08-07 MED ORDER — DICYCLOMINE HCL 20 MG PO TABS
20.0000 mg | ORAL_TABLET | Freq: Two times a day (BID) | ORAL | 0 refills | Status: AC
Start: 2023-08-07 — End: ?

## 2023-08-07 MED ORDER — IOHEXOL 300 MG/ML  SOLN
80.0000 mL | Freq: Once | INTRAMUSCULAR | Status: AC | PRN
  Administered 2023-08-07: 80 mL via INTRAVENOUS

## 2023-08-07 MED ORDER — ONDANSETRON HCL 4 MG/2ML IJ SOLN
4.0000 mg | Freq: Once | INTRAMUSCULAR | Status: AC
  Administered 2023-08-07: 4 mg via INTRAVENOUS
  Filled 2023-08-07: qty 2

## 2023-08-08 ENCOUNTER — Ambulatory Visit: Payer: Medicare Other | Admitting: Family Medicine

## 2023-08-08 LAB — OVA + PARASITE EXAM

## 2023-08-08 LAB — O&P RESULT

## 2023-08-14 ENCOUNTER — Ambulatory Visit (INDEPENDENT_AMBULATORY_CARE_PROVIDER_SITE_OTHER): Payer: Self-pay

## 2023-08-14 DIAGNOSIS — J309 Allergic rhinitis, unspecified: Secondary | ICD-10-CM

## 2023-08-15 ENCOUNTER — Ambulatory Visit: Payer: Medicare Other | Admitting: Emergency Medicine

## 2023-08-27 ENCOUNTER — Telehealth: Payer: Self-pay | Admitting: Neurology

## 2023-08-27 ENCOUNTER — Ambulatory Visit (INDEPENDENT_AMBULATORY_CARE_PROVIDER_SITE_OTHER): Payer: Self-pay

## 2023-08-27 DIAGNOSIS — J309 Allergic rhinitis, unspecified: Secondary | ICD-10-CM | POA: Diagnosis not present

## 2023-08-27 NOTE — Telephone Encounter (Signed)
Pt called to cx appt due to her being out of town. Will call back to r/s

## 2023-09-10 ENCOUNTER — Ambulatory Visit (INDEPENDENT_AMBULATORY_CARE_PROVIDER_SITE_OTHER): Payer: Medicare Other | Admitting: *Deleted

## 2023-09-10 DIAGNOSIS — J309 Allergic rhinitis, unspecified: Secondary | ICD-10-CM | POA: Diagnosis not present

## 2023-09-25 ENCOUNTER — Ambulatory Visit (INDEPENDENT_AMBULATORY_CARE_PROVIDER_SITE_OTHER): Payer: Medicare Other | Admitting: *Deleted

## 2023-09-25 ENCOUNTER — Ambulatory Visit: Payer: Medicare Other | Admitting: Cardiology

## 2023-09-25 ENCOUNTER — Ambulatory Visit: Payer: Medicare Other | Admitting: Allergy and Immunology

## 2023-09-25 DIAGNOSIS — J309 Allergic rhinitis, unspecified: Secondary | ICD-10-CM | POA: Diagnosis not present

## 2023-09-26 ENCOUNTER — Ambulatory Visit: Payer: Medicare Other

## 2023-10-02 ENCOUNTER — Other Ambulatory Visit: Payer: Self-pay

## 2023-10-02 ENCOUNTER — Ambulatory Visit (INDEPENDENT_AMBULATORY_CARE_PROVIDER_SITE_OTHER): Payer: Medicare Other | Admitting: Allergy and Immunology

## 2023-10-02 ENCOUNTER — Encounter: Payer: Self-pay | Admitting: Allergy and Immunology

## 2023-10-02 VITALS — BP 126/70 | HR 95 | Temp 98.5°F | Resp 18 | Ht 61.42 in | Wt 96.0 lb

## 2023-10-02 DIAGNOSIS — J301 Allergic rhinitis due to pollen: Secondary | ICD-10-CM

## 2023-10-02 DIAGNOSIS — J3089 Other allergic rhinitis: Secondary | ICD-10-CM

## 2023-10-02 DIAGNOSIS — K219 Gastro-esophageal reflux disease without esophagitis: Secondary | ICD-10-CM

## 2023-10-02 DIAGNOSIS — T781XXD Other adverse food reactions, not elsewhere classified, subsequent encounter: Secondary | ICD-10-CM | POA: Diagnosis not present

## 2023-10-02 DIAGNOSIS — J4520 Mild intermittent asthma, uncomplicated: Secondary | ICD-10-CM | POA: Diagnosis not present

## 2023-10-02 MED ORDER — FAMOTIDINE 20 MG PO TABS: 20.0000 mg | ORAL_TABLET | Freq: Every day | ORAL | 3 refills | Status: AC

## 2023-10-02 MED ORDER — EPINEPHRINE 0.3 MG/0.3ML IJ SOAJ: 3.0000 mg | INTRAMUSCULAR | 2 refills | Status: AC | PRN

## 2023-10-02 MED ORDER — LEVALBUTEROL TARTRATE 45 MCG/ACT IN AERO: 2.0000 | INHALATION_SPRAY | Freq: Four times a day (QID) | RESPIRATORY_TRACT | 3 refills | Status: AC | PRN

## 2023-10-02 MED ORDER — LORATADINE 10 MG PO TABS: 10.0000 mg | ORAL_TABLET | Freq: Every day | ORAL | 3 refills | Status: AC

## 2023-10-02 NOTE — Progress Notes (Unsigned)
[place] [facility] [place] [facility] [place] [facility] [place] [facility] [place]   Follow-up Note   Referring Provider: Tally Joe, MD Primary Provider: Patient, No Pcp Per Date of Office Visit: 10/02/2023  Subjective:   April Waters (DOB: [birth year]) is a 73 y.o. female who [facility] in re-evaluation of the following:  HPI: April Waters returns to this clinic in evaluation of allergic rhinoconjunctivitis, asthma, oral allergy syndrome.  I last saw her in this clinic 26 September 2022.  She has really done well while using immunotherapy currently at every 4 weeks without any adverse effect.  She was developing some side effects from the use of this medication but she no longer needs to use a H1 and H2 receptor blocker prior to receiving immunotherapy.  She will occasionally use some Claritin and occasionally use some famotidine in combination when she has a flareup of respiratory tract problems.  She has not used Xopenex in a prolonged period in time and has had very little issues with her lower airway.  She has not required a systemic steroid or an antibiotic for any type of airway issue.  She is very careful about eating raw fruits and vegetables.  She does occasionally have some heartburn.  This really is related to the types of foods that she eats.  She has had very little problems with her eyes.  She does use Systane on a pretty regular basis but has not had to use any other eyedrops.  She has obtained this years flu vaccine.  Allergies as of 10/02/2023       Reactions   Cardizem [diltiazem Hcl]    Codeine Other (See Comments)   Other Reaction(s): Not available   Hydrocodone Hives   Other Reaction(s): Not available   Hydromorphone Hcl    Other Reaction(s): Other (See Comments)   Oxycodone Hives   Other Reaction(s): Not available   Tyloxapol    Other Reaction(s): Not available   Vicodin [hydrocodone-acetaminophen] Other (See Comments)   Muscle  Weakness   Diltiazem Hcl Rash   Hydrocodone-acetaminophen Rash   Other Reaction(s): Not available Other Reaction(s): Other (See Comments) Loss of Consiousness   Hydromorphone Rash   Oxycodone-acetaminophen Rash   Other Reaction(s): Other (See Comments) Muscle tension   Penicillins Rash   Other Reaction(s): Not available   Yeast Other (See Comments), Rash        Medication List    acetaminophen 500 MG tablet Commonly known as: TYLENOL Take 500 mg by mouth every 6 (six) hours as needed. Reported on 11/29/2015   acetaminophen 325 MG tablet Commonly known as: TYLENOL Take 325 mg by mouth every 6 (six) hours as needed for moderate pain (pain score 4-6), mild pain (pain score 1-3), fever or headache.   acyclovir ointment 5 % Commonly known as: ZOVIRAX Apply 1 Application topically every 3 (three) hours.   albuterol 108 (90 Base) MCG/ACT inhaler Commonly known as: Ventolin HFA Inhale 2 puffs into the lungs every 4 (four) hours as needed for wheezing or shortness of breath.   ascorbic acid 500 MG tablet Commonly known as: VITAMIN C Take 500 mg by mouth daily.   B-12 PO Take by mouth.   B2 PO Take by mouth.   Calcium 600+D Plus Minerals 600-400 MG-UNIT Tabs Take 1 tablet by mouth as directed.   Cholecalciferol 10 MCG (400 UNIT) Caps Take 1 capsule by mouth daily.   dicyclomine 20 MG tablet Commonly known as: BENTYL Take 1 tablet (  20 mg total) by mouth 2 (two) times daily.   EPINEPHrine 0.3 mg/0.3 mL Soaj injection Commonly known as: EPI-PEN Inject 0.3 mg into the muscle as needed for anaphylaxis.   EPINEPHrine 0.3 mg/0.3 mL Soaj injection Commonly known as: EPI-PEN Inject 3 mg into the skin as needed.   EPINEPHrine 0.3 mg/0.3 mL Soaj injection Commonly known as: EpiPen 2-Pak Inject 0.3 mg into the muscle as needed for anaphylaxis.   famotidine 20 MG tablet Commonly known as: Pepcid Take 1 tablet (20 mg total) by mouth daily.   fluticasone 0.05 %  cream Commonly known as: CUTIVATE Apply 1 Application topically 2 (two) times daily.   lactulose 10 GM/15ML solution Commonly known as: CHRONULAC Take 15 mLs (10 g total) by mouth daily as needed for mild constipation.   levalbuterol 45 MCG/ACT inhaler Commonly known as: XOPENEX HFA Inhale 2 puffs into the lungs every 6 (six) hours as needed for wheezing.   loratadine 10 MG tablet Commonly known as: CLARITIN Take 1 tablet (10 mg total) by mouth daily.   Magnesium 300 MG Caps Take 1 capsule by mouth daily.   meloxicam 15 MG tablet Commonly known as: MOBIC Take 1 tablet by mouth daily.   Olopatadine HCl 0.2 % Soln Commonly known as: Pataday Apply 1 drop to eye daily as needed.   ondansetron 8 MG disintegrating tablet Commonly known as: ZOFRAN-ODT 8mg  ODT q8 hours prn nausea   peppermint oil liquid   PEPPERMINT OIL PO   sucralfate 1 g tablet Commonly known as: CARAFATE Take 1 tablet (1 g total) by mouth 4 (four) times daily -  with meals and at bedtime for 10 days.   Systane Complete 0.6 % Soln Generic drug: Propylene Glycol Apply 1 drop to eye in the morning, at noon, in the evening, and at bedtime.   VITAMIN B-12 PO Take 1 tablet by mouth daily.   VITAMIN K PO Take by mouth.    Past Medical History:  Diagnosis Date   Allergy    Asthma    C. difficile enteritis    Chicken pox    Common migraine with intractable migraine 05/26/2015   Malachi Carl infection 1986   lasted about a year late diagnosis   Measles    Osteoporosis    Rheumatoid arthritis (HCC)     Past Surgical History:  Procedure Laterality Date   ANTERIOR CRUCIATE LIGAMENT REPAIR  09/04/1996   right   BREAST CYST EXCISION Right    when she was 15   cebaceous cyst     right breast - age 2   fluid knee  09/04/1992   left   HERNIA REPAIR     bilateral inguinal 2 months   KIDNEY STONE SURGERY     1980   KNEE SURGERY  09/04/1988   medial meniscous right   LAPAROSCOPY  09/05/1991    endometriosis   nasal polyps     age 19    Review of systems negative except as noted in HPI / PMHx or noted below:  Review of Systems  Constitutional: Negative.   HENT: Negative.    Eyes: Negative.   Respiratory: Negative.    Cardiovascular: Negative.   Gastrointestinal: Negative.   Genitourinary: Negative.   Musculoskeletal: Negative.   Skin: Negative.   Neurological: Negative.   Endo/Heme/Allergies: Negative.   Psychiatric/Behavioral: Negative.       Objective:   Vitals:   10/02/23 1517  BP: 126/70  Pulse: 95  Resp: 18  Temp: 98.5  F (36.9 C)  SpO2: 99%   Height: 5' 1.42" (156 cm)  Weight: 96 lb (43.5 kg)   Physical Exam Constitutional:      Appearance: She is not diaphoretic.  HENT:     Head: Normocephalic.     Right Ear: Tympanic membrane, ear canal and external ear normal.     Left Ear: Tympanic membrane, ear canal and external ear normal.     Nose: Nose normal. No mucosal edema or rhinorrhea.     Mouth/Throat:     Pharynx: Uvula midline. No oropharyngeal exudate.  Eyes:     Conjunctiva/sclera: Conjunctivae normal.  Neck:     Thyroid: No thyromegaly.     Trachea: Trachea normal. No tracheal tenderness or tracheal deviation.  Cardiovascular:     Rate and Rhythm: Normal rate and regular rhythm.     Heart sounds: Normal heart sounds, S1 normal and S2 normal. No murmur heard. Pulmonary:     Effort: No respiratory distress.     Breath sounds: Normal breath sounds. No stridor. No wheezing or rales.  Lymphadenopathy:     Head:     Right side of head: No tonsillar adenopathy.     Left side of head: No tonsillar adenopathy.     Cervical: No cervical adenopathy.  Skin:    Findings: No erythema or rash.     Nails: There is no clubbing.  Neurological:     Mental Status: She is alert.     Diagnostics: Spirometry was performed and demonstrated an FEV1 of 2.45 at 134 % of predicted.   Assessment and Plan:   1. Perennial allergic rhinitis   2.  Seasonal allergic rhinitis due to pollen   3. Asthma, mild intermittent, well-controlled   4. Pollen-food allergy, subsequent encounter   5. Gastroesophageal reflux disease without esophagitis    1.  Continue immunotherapy with Green 0.5 as maximal dose  2.  If needed:   A. Xopenx HFA - 2 inhalations every 4-6 hours  B. Claritin 10 mg  - 1 tablet 1 time per day  C. Famotidine 20 mg - 1 tablet 1 time per day  D. Epi-pen, benadryl, MD/ER evalution for allergic reaction.  E. Systane - multiple times per day  3. Influenza = Tamiflu. Covid = Paxlovid (home combo flu/covid swab)  4. Return to clinic in 12 months or earlier if problem.   Overall Frenchie has really done very well on her current plan and she will continue to use immunotherapy and she has a collection of other agents that can be utilized should they be required.  I will see her back in this clinic in 1 year or earlier if there is a problem.  Laurette Schimke, MD Allergy / Immunology [facility]

## 2023-10-02 NOTE — Patient Instructions (Addendum)
  1.  Continue immunotherapy with Green 0.5 as maximal dose  2.  If needed:   A. Xopenx HFA - 2 inhalations every 4-6 hours  B. Claritin 10 mg  - 1 tablet 1 time per day  C. Famotidine 20 mg - 1 tablet 1 time per day  D. Epi-pen, benadryl, MD/ER evalution for allergic reaction.  E. Systane - multiple times per day  3. Influenza = Tamiflu. Covid = Paxlovid (home combo flu/covid swab)  4. Return to clinic in 12 months or earlier if problem.

## 2023-10-03 ENCOUNTER — Encounter: Payer: Self-pay | Admitting: Allergy and Immunology

## 2023-10-08 ENCOUNTER — Ambulatory Visit (INDEPENDENT_AMBULATORY_CARE_PROVIDER_SITE_OTHER): Payer: Self-pay

## 2023-10-08 ENCOUNTER — Ambulatory Visit: Payer: Medicare Other | Admitting: Family Medicine

## 2023-10-08 DIAGNOSIS — J309 Allergic rhinitis, unspecified: Secondary | ICD-10-CM | POA: Diagnosis not present

## 2023-10-11 ENCOUNTER — Encounter (INDEPENDENT_AMBULATORY_CARE_PROVIDER_SITE_OTHER): Payer: Self-pay

## 2023-10-11 ENCOUNTER — Ambulatory Visit (INDEPENDENT_AMBULATORY_CARE_PROVIDER_SITE_OTHER): Payer: Medicare Other | Admitting: Otolaryngology

## 2023-10-11 VITALS — BP 135/72 | HR 74 | Ht 62.0 in | Wt 97.0 lb

## 2023-10-11 DIAGNOSIS — H6123 Impacted cerumen, bilateral: Secondary | ICD-10-CM

## 2023-10-14 DIAGNOSIS — H6123 Impacted cerumen, bilateral: Secondary | ICD-10-CM | POA: Insufficient documentation

## 2023-10-14 NOTE — Progress Notes (Signed)
 Patient ID: April Waters, female   DOB: May 28, 1951, 73 y.o.   MRN: [id number]  Procedure: Bilateral cerumen disimpaction.   Indication: Cerumen impaction, resulting in ear discomfort and conductive hearing loss.   Description: The patient is placed supine on the operating table. Under the operating microscope, the right ear canal is examined and is noted to be impacted with cerumen. The cerumen is carefully removed with a combination of suction catheters, cerumen curette, and alligator forceps. After the cerumen removal, the ear canal and tympanic membrane are noted to be normal. No middle ear effusion is noted. The same procedure is then repeated on the left side without exception. The patient tolerated the procedure well.  Follow-up care:  The patient is instructed not to use Q-tips to clean the ear canals. The patient will be moving to [place] .  She will follow-up with a local ENT physician

## 2023-10-15 ENCOUNTER — Ambulatory Visit: Payer: Medicare Other | Admitting: Family

## 2023-10-22 ENCOUNTER — Ambulatory Visit (INDEPENDENT_AMBULATORY_CARE_PROVIDER_SITE_OTHER): Payer: Self-pay

## 2023-10-22 DIAGNOSIS — J309 Allergic rhinitis, unspecified: Secondary | ICD-10-CM

## 2023-10-23 ENCOUNTER — Encounter: Payer: Self-pay | Admitting: Podiatry

## 2023-10-23 ENCOUNTER — Ambulatory Visit: Payer: Medicare Other | Admitting: Podiatry

## 2023-10-23 ENCOUNTER — Ambulatory Visit (INDEPENDENT_AMBULATORY_CARE_PROVIDER_SITE_OTHER): Payer: Medicare Other | Admitting: Podiatry

## 2023-10-23 DIAGNOSIS — M069 Rheumatoid arthritis, unspecified: Secondary | ICD-10-CM | POA: Diagnosis not present

## 2023-10-23 DIAGNOSIS — I7300 Raynaud's syndrome without gangrene: Secondary | ICD-10-CM

## 2023-10-23 DIAGNOSIS — L84 Corns and callosities: Secondary | ICD-10-CM | POA: Diagnosis not present

## 2023-10-23 NOTE — Progress Notes (Signed)
 This patient returns to my office for at risk foot care.  This patient requires this care by a professional since this patient will be at risk due to having raynauds and RA. This patient is unable to cut callus herself since the patient cannot reach her callus These calluses are painful walking and wearing shoes.  This patient presents for at risk foot care today.  General Appearance  Alert, conversant and in no acute stress.  Vascular  Dorsalis pedis and posterior tibial  pulses are palpable  bilaterally.  Capillary return is within normal limits  bilaterally. Temperature is within normal limits  bilaterally.  Neurologic  Senn-Weinstein monofilament wire test within normal limits  bilaterally. Muscle power within normal limits bilaterally.  Nails Thick disfigured discolored nails with subungual debris  from hallux to fifth toes bilaterally. No evidence of bacterial infection or drainage bilaterally.  Orthopedic  No limitations of motion  feet .  No crepitus or effusions noted.  No bony pathology or digital deformities noted.  Skin  normotropic skin with no porokeratosis noted bilaterally.  No signs of infections or ulcers noted.   Callus sub 5th met left foot.  Callus sub 2nd right foot.  Onychomycosis  Pain in right toes  Pain in left toes  Consent was obtained for treatment procedures.   Mechanical debridement of nails 1-5  bilaterally performed with a nail nipper.  Filed with dremel without incident.    Return office visit  prn                   Told patient to return for periodic foot care and evaluation due to potential at risk complications.   Helane Gunther DPM

## 2023-10-26 ENCOUNTER — Ambulatory Visit
Admission: RE | Admit: 2023-10-26 | Discharge: 2023-10-26 | Disposition: A | Discharge status: Home / Self Care | Payer: Medicare Other | Source: Ambulatory Visit | Attending: Physician Assistant | Admitting: Physician Assistant

## 2023-10-26 DIAGNOSIS — Z1231 Encounter for screening mammogram for malignant neoplasm of breast: Secondary | ICD-10-CM

## 2023-10-29 ENCOUNTER — Ambulatory Visit (INDEPENDENT_AMBULATORY_CARE_PROVIDER_SITE_OTHER): Payer: Self-pay

## 2023-10-29 DIAGNOSIS — J309 Allergic rhinitis, unspecified: Secondary | ICD-10-CM

## 2023-10-30 ENCOUNTER — Ambulatory Visit: Payer: Medicare Other | Admitting: Gastroenterology

## 2023-10-30 ENCOUNTER — Institutional Professional Consult (permissible substitution): Payer: Medicare Other | Admitting: Neurology

## 2023-11-05 ENCOUNTER — Ambulatory Visit (INDEPENDENT_AMBULATORY_CARE_PROVIDER_SITE_OTHER): Admitting: *Deleted

## 2023-11-05 DIAGNOSIS — J309 Allergic rhinitis, unspecified: Secondary | ICD-10-CM | POA: Diagnosis not present

## 2023-11-12 ENCOUNTER — Ambulatory Visit (INDEPENDENT_AMBULATORY_CARE_PROVIDER_SITE_OTHER): Payer: Self-pay

## 2023-11-12 DIAGNOSIS — J309 Allergic rhinitis, unspecified: Secondary | ICD-10-CM

## 2023-11-20 ENCOUNTER — Ambulatory Visit (INDEPENDENT_AMBULATORY_CARE_PROVIDER_SITE_OTHER): Payer: Self-pay | Admitting: *Deleted

## 2023-11-20 DIAGNOSIS — J309 Allergic rhinitis, unspecified: Secondary | ICD-10-CM | POA: Diagnosis not present
